# Patient Record
Sex: Male | Born: 1955
Health system: Southern US, Community
[De-identification: ages and names within clinical notes are randomized; demographics above are authoritative.]

## PROBLEM LIST (undated history)

## (undated) DIAGNOSIS — IMO0002 Reserved for concepts with insufficient information to code with codable children: Secondary | ICD-10-CM

## (undated) DIAGNOSIS — K259 Gastric ulcer, unspecified as acute or chronic, without hemorrhage or perforation: Secondary | ICD-10-CM

## (undated) DIAGNOSIS — G5602 Carpal tunnel syndrome, left upper limb: Secondary | ICD-10-CM

## (undated) DIAGNOSIS — I10 Essential (primary) hypertension: Secondary | ICD-10-CM

## (undated) DIAGNOSIS — R609 Edema, unspecified: Secondary | ICD-10-CM

## (undated) DIAGNOSIS — M109 Gout, unspecified: Secondary | ICD-10-CM

## (undated) HISTORY — DX: Edema, unspecified: R60.9

## (undated) HISTORY — PX: ABDOMINAL SURGERY: SHX537

## (undated) HISTORY — DX: Essential (primary) hypertension: I10

## (undated) HISTORY — DX: Reserved for concepts with insufficient information to code with codable children: IMO0002

## (undated) HISTORY — PX: OTHER SURGICAL HISTORY: SHX169

---

## 1997-04-23 ENCOUNTER — Emergency Department (HOSPITAL_COMMUNITY): Admission: EM | Admit: 1997-04-23 | Discharge: 1997-04-23 | Payer: Self-pay | Admitting: Emergency Medicine

## 1997-06-16 ENCOUNTER — Encounter: Admission: RE | Admit: 1997-06-16 | Discharge: 1997-06-16 | Payer: Self-pay | Admitting: *Deleted

## 2000-05-05 ENCOUNTER — Emergency Department (HOSPITAL_COMMUNITY): Admission: EM | Admit: 2000-05-05 | Discharge: 2000-05-05 | Payer: Self-pay | Admitting: Emergency Medicine

## 2000-07-27 ENCOUNTER — Emergency Department (HOSPITAL_COMMUNITY): Admission: EM | Admit: 2000-07-27 | Discharge: 2000-07-28 | Payer: Self-pay | Admitting: Emergency Medicine

## 2002-07-13 ENCOUNTER — Emergency Department (HOSPITAL_COMMUNITY): Admission: EM | Admit: 2002-07-13 | Discharge: 2002-07-13 | Payer: Self-pay | Admitting: Emergency Medicine

## 2002-07-13 ENCOUNTER — Encounter: Payer: Self-pay | Admitting: Emergency Medicine

## 2002-12-06 ENCOUNTER — Emergency Department (HOSPITAL_COMMUNITY): Admission: EM | Admit: 2002-12-06 | Discharge: 2002-12-06 | Payer: Self-pay | Admitting: Emergency Medicine

## 2002-12-07 ENCOUNTER — Inpatient Hospital Stay (HOSPITAL_COMMUNITY): Admission: EM | Admit: 2002-12-07 | Discharge: 2002-12-12 | Payer: Self-pay | Admitting: Emergency Medicine

## 2002-12-14 ENCOUNTER — Encounter (HOSPITAL_BASED_OUTPATIENT_CLINIC_OR_DEPARTMENT_OTHER): Admission: RE | Admit: 2002-12-14 | Discharge: 2003-03-14 | Payer: Self-pay | Admitting: Orthopedic Surgery

## 2006-07-18 ENCOUNTER — Inpatient Hospital Stay (HOSPITAL_COMMUNITY): Admission: EM | Admit: 2006-07-18 | Discharge: 2006-07-22 | Payer: Self-pay | Admitting: *Deleted

## 2006-07-18 ENCOUNTER — Ambulatory Visit: Payer: Self-pay | Admitting: Family Medicine

## 2008-06-08 IMAGING — CR DG CHEST 1V PORT
1 series · 1 of 1 positions shown · non-contrast
Comparison: None.

CLINICAL DATA: Chest pain. 
 PORTABLE CHEST - 07/18/2006:

[view not recorded]
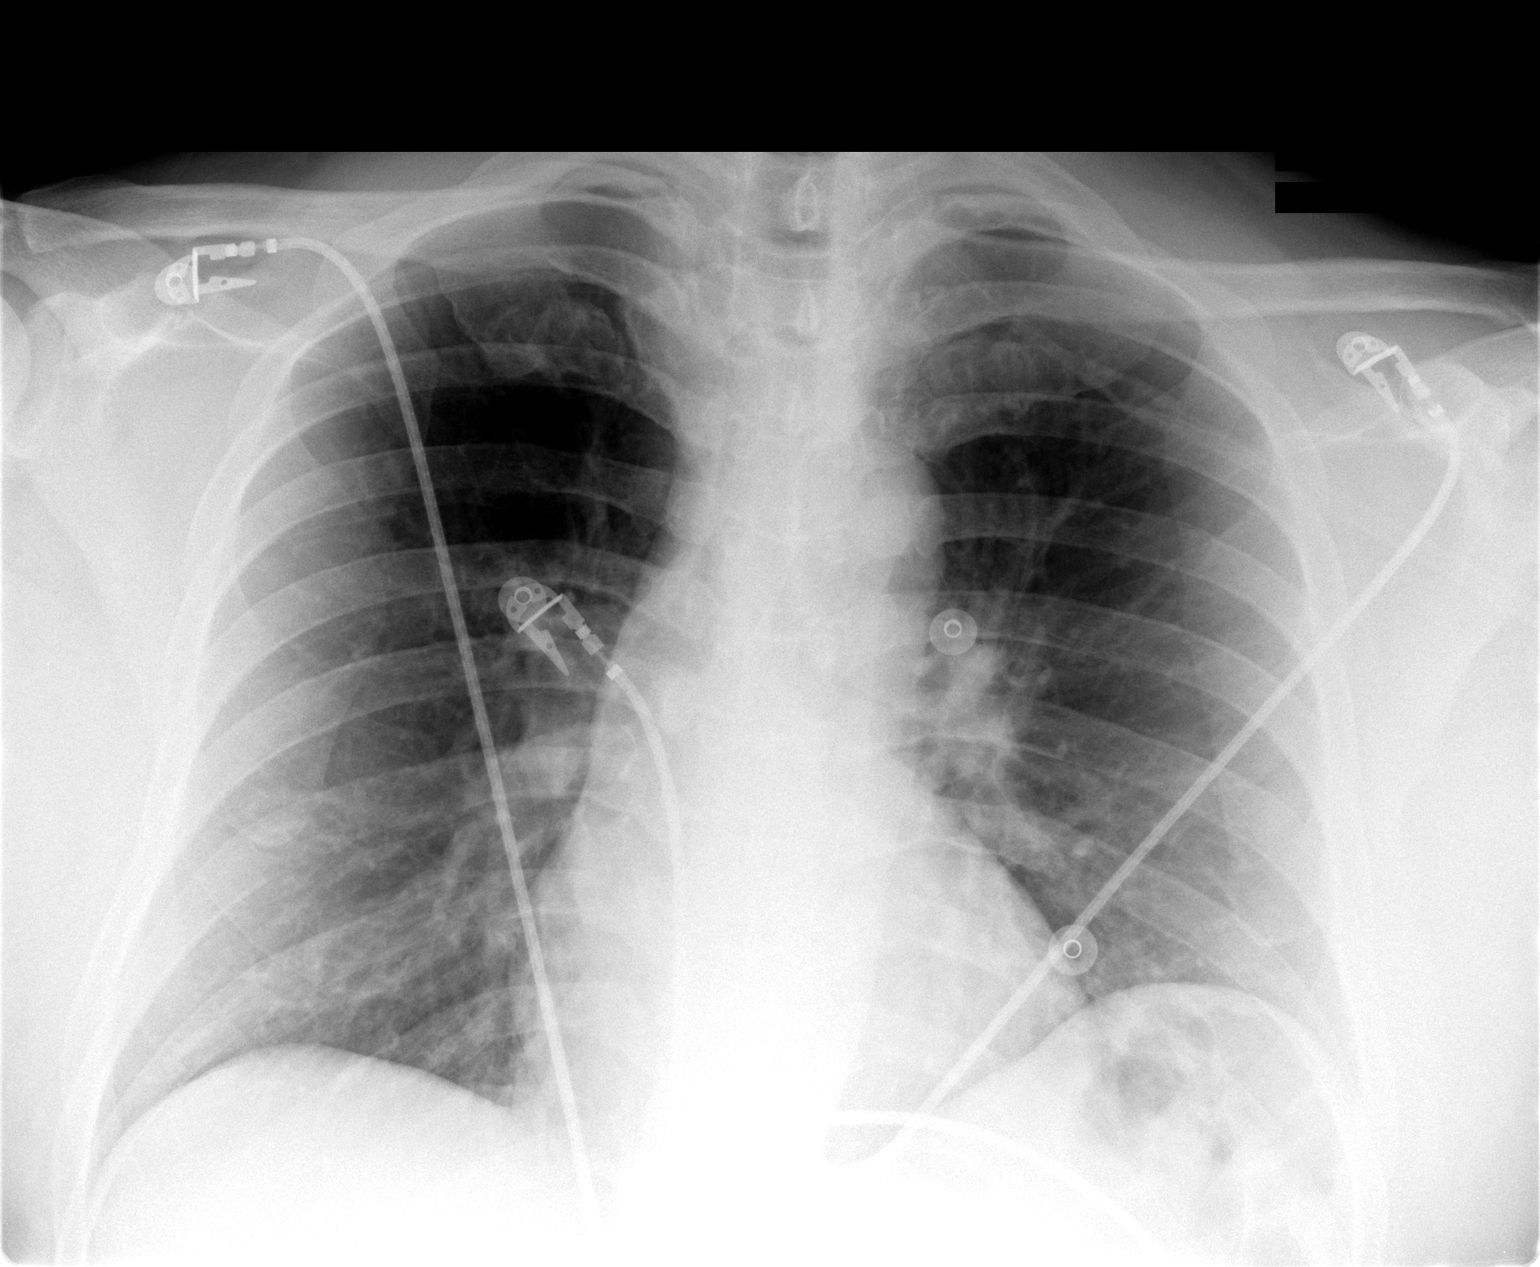

[1 of 1 positions shown; findings below may reference images not displayed]

FINDINGS: Cardiac silhouette, mediastinal and hilar contours are within normal limits.  Mild eventration of the left hemidiaphragm.  Minimal streaky basilar atelectasis due to low volume inspiration.  Probable emphysematous changes in the right upper lobe in particular.
IMPRESSION: No acute cardiopulmonary findings.  Minimal streaky bibasilar atelectasis and probable changes of COPD.

## 2010-05-16 NOTE — H&P (Signed)
Matthew Foster, Matthew Foster               ACCOUNT NO.:  [id number]   MEDICAL RECORD NO.:  [id number]          PATIENT TYPE:  EMS   LOCATION:  MAJO                         FACILITY:  [facility]   PHYSICIAN:  Pearlean Brownie, M.D.DATE OF BIRTH:  02-06-1955   DATE OF ADMISSION:  06/18/2006  DATE OF DISCHARGE:                              HISTORY & PHYSICAL   PRIMARY CARE PHYSICIAN:  [facility].  He will be admitted to the family  practice teaching services as an unassigned patient.   CHIEF COMPLAINT:  Chest pain.   HISTORY OF PRESENT ILLNESS:  Patient is a 55 year old African-American  male with a past medical history of hypertension who woke up last night  with chest pain that persisted throughout the night and all day today.  The pain is described as pressure and squeezing.  It is substernal with  radiation to the left shoulder and is associated with shortness of  breath, very mild diaphoresis.  It is constant and not changed with  exertion.  It was relieved with nitroglycerin.  He initially presented  to [facility] tonight and received nitroglycerin which decreased the pain  from 5/10 then to 3/10.  He received further nitroglycerin while in  transport in ambulance as well as in the emergency department; his pain  is now 1/10.  At [facility], he was noted to have T wave changes on his  EKG with inverted T waves in leads V4-V6 and so he was transferred via  EMS to [facility].  Of note, he has been having some left shoulder as  well as neck pain that was felt to be musculoskeletal in etiology.  He  has been having this pain for several weeks.  This pain is not  exertional and is completely different from the current pain that he is  having.  He was scheduled to have an exercise treadmill test later this  month for further risk stratification.   PAST MEDICAL HISTORY:  1. Hypertension.  2. Gout.   PAST SURGICAL HISTORY:  1. Ulcer surgery in 1980's.  2. I&D of right hand with MRSA abscess in  2005.   MEDICATIONS:  1. Lisinopril 20 mg p.o. q.day.  2. Atenolol 100 mg p.o. q.day.   ALLERGIES:  No known drug allergies.   FAMILY HISTORY:  Dad with diabetes, hypertension; he died at age 40  secondary to complications from diabetes.  His mom with diabetes,  hypertension; she died of a CVA at age 58 and she had an MI shortly  after her CVA.  Four brothers with hypertension, one of which has  diabetes.  He has two sisters who are healthy.   SOCIAL HISTORY:  He is married to his wife, Tyrell Antonio; they have  four children and one grandchild.  He works as a Naval architect.  He  denies any tobacco or alcohol use.  No illicit drug use.   REVIEW OF SYSTEMS:  He denies any orthopnea, nausea, vomiting, cough,  fevers, chills, calf pain or swelling.   PHYSICAL EXAMINATION:  VITALS:  Temperature 99.4, pulse 68, blood  pressure 137/78, respiratory rate 18, 99% on  two liters.  GENERAL:  Alert in no acute distress.  HEENT:  Poor dentition in his upper jaw.  Mucous membranes are moist.  Pupils are equally round and reactive to light and accommodation.  Extraocular motion intact.  Normal optic fundi.  NECK:  No JVD.  No carotid bruits.  No thyromegaly or thyroid nodules.  LUNGS:  Clear to auscultation bilaterally.  CARDIOVASCULAR EXAM:  Regular rate and rhythm.  No murmurs, rubs or  gallops.  CHEST WALL:  Nontender to palpation.  ABDOMINAL EXAM:  Positive surgical scar vertical in the epigastric  region.  Positive bowel sounds.  Soft, nontender, nondistended.  EXTREMITIES:  No cyanosis, clubbing, or edema.  No calf swelling or  tenderness.  Negative Homans sign.  NEURO:  Alert and oriented x3.  RECTAL EXAM:  Good sphincter tone.  Normal size prostate.  Stool was  Hemoccult-negative.   LABORATORY DATA:  White blood cell count 6.4, hemoglobin 14.2,  hematocrit 41.8, platelet count 270.  Sodium 140, potassium 4.1,  chloride 105, bicarb 30, BUN 17, creatinine 1.5, glucose 99.  Point-of-   care negative x2.   Chest x-ray:  No acute cardiopulmonary findings, streaky bibasilar  atelectasis.   EKG:  Normal sinus rhythm at 74 beats per minute, new inverted T waves  in lead V4-V6 with T wave flattening in leads V2-V3.   ASSESSMENT/PLAN:  A 55 year old male with hypertension now with chest  pain.   1. Chest pain substernally, relived with nitroglycerin, is constant      with EKG changes.  Possible unstable angina.  We will load with      Plavix 300 mg p.o. x1.  We will start heparin drip and use      nitroglycerin as needed.  We will cycle his cardiac enzymes.  We      will risk stratify by checking fasting lipid panel.  We will      consult cardiology in the morning if chest pain continues or if his      cardiac enzymes elevate.  Patient low-risk Wells criteria so we      will not check D-dimer at this point, and he also has a normal      pulse and no oxygen requirement.  2. Hypertension.  We will continue his lisinopril and Tylenol.  3. Gout.  No acute issues currently.  4. History of peptic ulcer disease.  Currently without abdominal pain.      Stool is heme-negative.  We will start Protonix incase GI etiology      of chest pain.      Benn Moulder, M.D.  Electronically Signed      Pearlean Brownie, M.D.  Electronically Signed    MR/MEDQ  D:  07/18/2006  T:  07/19/2006  Job:  [id number]

## 2010-05-16 NOTE — Cardiovascular Report (Signed)
[name], Matthew Foster               ACCOUNT NO.:  [id number]   MEDICAL RECORD NO.:  [id number]          PATIENT TYPE:  INP   LOCATION:  3713                         FACILITY:  [facility]   PHYSICIAN:  Madaline Savage, M.D.DATE OF BIRTH:  Nov 14, 1955   DATE OF PROCEDURE:  07/19/2006  DATE OF DISCHARGE:                            CARDIAC CATHETERIZATION   PROCEDURES PERFORMED.:  1. Selective coronary angiography by Judkins technique.  2. Retrograde left heart catheterization.  3. Left ventricular angiography.   COMPLICATIONS:  None.   ENTRY SITE:  Right femoral.   DYE USED:  Omnipaque.   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 Pearlean Brownie for chest pain.  His  EKG between the day of admission and today shows interval worsening of  mild ST-segment depressions in the anterolateral leads.  That along with  his chest pain is suspicion enough to warrant today's cardiac  catheterization which was performed electively without complications.   RESULTS:   PRESSURES:  Left ventricular pressure was 140/13, end-diastolic pressure  13.  Central aortic pressure 145/90 with a mean of 117.  No aortic valve  gradient by pullback technique of any significance.   ANGIOGRAPHIC RESULTS:  All vessels in the left main, LAD and left  circumflex distribution were normal.   Anatomically, there was one large septal perforator branch, one medium  diagonal, one obtuse marginal and the LAD and circumflex main branches  were normal.  The right coronary artery was large and dominant.  There  was a live tortuosity proximally after a mild shepherd's crook deformity  in the proximal RCA.  There was a 50-60% area of mild narrowing in the  proximal portion of the vessel.  Left ventricular ejection fraction was  60%.  There was no evidence of mitral regurgitation and qualitative wall  motion analysis revealed that there were no abnormalities noted.   IMPRESSION:  1. Mild single-vessel coronary disease.  2. Normal LV systolic function.   PLAN:  Continued medical therapy.  Suggest a functional test for this  patient, possibly adenosine Myoview, and follow-up.           ______________________________  Madaline Savage, M.D.     WHG/MEDQ  D:  07/19/2006  T:  07/20/2006  Job:  [id number]   cc:   Pearlean Brownie, M.D.  Cardiac Catheterization Laboratory

## 2010-05-16 NOTE — Discharge Summary (Signed)
[name], Foster               ACCOUNT NO.:  [id number]   MEDICAL RECORD NO.:  [id number]          PATIENT TYPE:  INP   LOCATION:  3713                         FACILITY:  [facility]   PHYSICIAN:  Matthew Foster, M.D.DATE OF BIRTH:  1955/02/28   DATE OF ADMISSION:  07/18/2006  DATE OF DISCHARGE:  07/22/2006                               DISCHARGE SUMMARY   PRIMARY Foster PHYSICIAN:  Matthew Foster in [place], [place].   CONSULTANTS:  Dr. Ezequiel Foster with [facility].   PROCEDURES:  1. Cardiac catheterization on July 19, 2006.  IMPRESSION:  Mild single-vessel coronary artery disease with 50 to 60% narrowing of  the proximal right coronary artery; normal left ventricular systolic  function.  1. Adenosine stress Myoview on July 21, 2006.   IMPRESSION:  Normal left ventricular function and wall motion, ejection fraction of  51%, anteroseptal perfusion defect on both rest and stress studies which  was more pronounced on the stress study.  Component of scar suspected,  and there may be also inducible ischemia in this territory given that  the defect is slightly more pronounced after adenosine administration.   REASON FOR ADMISSION:  Chest pain.   DISCHARGE DIAGNOSES:  PRIMARY DIAGNOSES:  1. Coronary artery disease.  2. Coronary artery vasospasm.   SECONDARY DIAGNOSES:  1. Hypertension.  2. Hyperlipidemia.  3. Gout.   DISCHARGE MEDICATIONS:  1. Aspirin 81 mg p.o. daily.  2. Zocor 20 mg p.o. daily (Prescription given, dispense #30, no      refills).  3. Norvasc 5 mg p.o. daily (Prescription given, dispense #30, no      refills).  4. Atenolol 100 mg p.o. daily.  5. Allopurinol 300 mg as previously prescribed.   HOSPITAL COURSE:  The patient is a 55 year old African-American male who  presented to the ED with chest pain that persisted throughout the night  and all day during the day of presentation.  The patient described the  pain as pressure and  squeezing.  It was substernal and radiated to the  left shoulder.  It was associated with shortness of breath and mild  diaphoresis.  The pain was constant and did not change with exertion.  The pain was also relieved with nitroglycerin.   1. Coronary artery disease--given [name] presentation and some      initial nonspecific T wave inversions on an outside EKG there was      concern that the patient was experiencing an NSTEMI.  Cardiology      was consulted and involved throughout the patient's      hospitalization.  His cardiac enzymes were cycled x3 and remained      consistently negative.  The morning following admission the      patient's repeat EKG had some worsening of mild ST segment      depressions in the anterolateral lead.  Therefore, the decision was      made by cardiology to take the patient to a cardiac      catheterization.  He was found to have some mild single-vessel      coronary  disease.  A functional test was recommended.  In the      interim the patient was treated as if he was having an NSTEMI and,      therefore, was on a heparin drip which was switched to Lovenox 120      mg subcutaneously q.12h.  On admission he had been loaded with      Plavix 300 mg and continued on 75 mg p.o. daily.  He underwent a      stress Myoview with results as described previously.  Therefore,      his Plavix, Lovenox and full-dose aspirin were discontinued.  The      patient was previously on atenolol and lisinopril for blood      pressure control.  As the patient does not have any specific      indications for an ACE inhibitor in particular, and because of the      questions by Cardiology of coronary artery vasospasm, the patient      was started on Norvasc 5 mg p.o. daily and the Lisinopril      discontinued.  The patient also should continue taking 81 mg      aspirin daily as well as Zocor 20 mg daily.  The patient has a      follow up scheduled with Dr. Elsie Foster of   Cardiology and is to follow      up with Matthew Foster on Friday July 26, 2006.  2. Hyperlipidemia--As the patient presented with chest pain concerning      for acute coronary syndrome he did have a fasting lipid panel drawn      from risk stratification purposes.  He was found to have a      triglyceride level of 70, HDL cholesterol level of 35 and LDL      cholesterol level of 86.  Given his low HDL and his known coronary      artery disease, the patient was started on Zocor 20 mg p.o. daily.      The patient will need monitoring as an outpatient for side effects      of this medication and for monitoring of his cholesterol levels      hopefully with increase of his HDL cholesterol and decrease of his      LDL cholesterol even further.  3. Hypertension--The patient was taking lisinopril and atenolol upon      admission.  Given his question of coronary vasospasm and no      specific indications for an ACE inhibitor therapy such as      congestive heart failure or diabetes, the Lisinopril was      discontinued and the patient discharged on Norvasc 5 mg p.o. daily.      He will need follow up with his primary Foster Matthew Foster for further      titration of his blood pressure medication to insure that they are      at goal.  On the day of discharge the patient's blood pressure was      ranging from 117 to 147/77 to 97.  Would not recommend further      increase in his beta blocker as his heart rate was in the 50s.   PATIENT CONDITION AT TIME OF DISCHARGE:  Stable.   DISCHARGE LABORATORY STUDIES:  Triglyceride 70, HDL 35, LDL 86.  Sodium  140, potassium 4.0, BUN 8, creatinine 1.01.  Hemoglobin 14.1.   DISPOSITION:  The patient is discharged home.  DISCHARGE FOLLOW UP:  1. The patient is to return to [facility] on Friday, July 29, 2006 for      hospital follow up.  The patient expressed a desire to continue      receiving his primary Foster needs at [facility] and not establishing      Foster at  an alternative office.  2. Dr. Elsie Foster of cardiology on Friday, August 09, 2006 at 12 o'clock      p.m.   FOLLOW UP ISSUES:  1. [name] lisinopril was discontinued as discussed previously      and substituted for Norvasc 5 mg p.o. daily.  The patient will need      monitoring of his high blood pressure.  2. The patient was started on a statin and will need monitoring for      side effects and muscle or liver damage.      Matthew Franklin, MD  Electronically Signed      Matthew Foster, M.D.  Electronically Signed    TCB/MEDQ  D:  07/22/2006  T:  07/23/2006  Job:  [id number]

## 2010-05-19 NOTE — Discharge Summary (Signed)
Matthew Foster, Matthew Foster                           ACCOUNT NO.:  [id number]   MEDICAL RECORD NO.:  [id number]                   PATIENT TYPE:  INP   LOCATION:  0359                                 FACILITY:  [facility]   PHYSICIAN:  Dionne Ano. Amanda Pea, M.D.             DATE OF BIRTH:  [birth year]   DATE OF ADMISSION:  12/07/2002  DATE OF DISCHARGE:  12/12/2002                                 DISCHARGE SUMMARY   ADMISSION DIAGNOSES:  1. Left ring, left index finger abscess.  2. Previous history of hypertension.  3. Questionable history of diabetes mellitus.   DISCHARGE DIAGNOSES:  1. Left ring, left index finger abscess, improved.  2. Previous history of hypertension.  3. Questionable history of diabetes mellitus.   SURGEON:  Dionne Ano. Amanda Pea, M.D.   CONSULTATIONS:  None.   BRIEF HISTORY:  Matthew Foster is a 55 year old gentleman who presented to the  [facility] emergency room for evaluation of his left ring and index  fingers. He states that he had noted pain, tenderness, and swelling for  approximately a week's time without incidence of injury. He stated that the  swelling and pain had increased to the point where he was having difficulty  utilizing the hand. Therefore, he presented to the emergency room for  evaluation. The patient does have a history of hypertension and notes on his  review of systems that he does has frequent nocturia and had a questionable  history of diabetes at one time, but had not followed up for this. He  currently sees urgent care for his medical needs, but states he has not been  there in quite a time.   Physical examination showed dorsal soft tissue swelling with localized  erythema and fluctuance over the middle finger and index finger by the left  hand indicative of two separate abscesses. The mid palm, thenar, and  hypothenar space were quiescent. Neurovasculature intact. Normal range of  motion.  ___________ index and ring finger as these were certainly  tender  which limited his range of motion. The patient underwent I&D about the left  index and ring fingers while in the emergency department setting and  tolerated this very well. Wick and soft dressings were applied, and the  patient was admitted for IV antibiotics, and continued wound care.   HOSPITAL COURSE:  Matthew Foster was admitted on December 07, 2002, he tolerated  his I&D very well. He was placed on Ancef prophylactically his final  cultures were obtained. On December __________, 2004, Gram stain showed gram-  positive cocci in pairs. Due to his questionable history of, hemoglobin A1C  had previously been ordered and was 6.0.  His blood pressure was 163/95 and  temperature of 98. Upper extremity showed the digits had excellent refill,  sensation intact. Over the course of the next several days, the patient  continued hydrotherapy and his IV antibiotics.  It was noted that he  had  reaccumulation about the abscess and was incised and drained at bedside  without difficulty. He tolerated that very well. There were no  complications. Final cultures showed moderate MRSA sensitive to Levaquin,  vancomycin, tetracycline, and doxycycline. At this juncture, vancomycin was  begun for pharmacy dosing and continued whirlpool and wound care was  employed. On December 12, 2002, the patient's wounds were healing nicely  with no reaccumulation of infection noted. He was relatively nontender and  ____________ for discharge which was in improved condition. The decision was  made to discharge him home.   FINAL DIAGNOSES:  1. Status post incision and drainage times two, ring and index finger     abscess, with cultures indicative of methicillin-resistant Staphylococcus     aureus.  2. History of hypertension.   PLAN:   CONDITION ON DISCHARGE:  Improved.   DIET:  Regular.   ACTIVITY:  Encourage range of motion to the hand and fingers, daily wound  care, and the patient will be set up for  outpatient hydrotherapy for Monday  and subsequently will change his dressings Tuesday and Thursday at home.   DISCHARGE MEDICATIONS:  1. Tetracycline 500 mg one p.o. q.i.d. times four weeks.  2. Percocet.  3. Robaxin.  4. Colace.  5. Allopurinol as the patient did have an inpatient exacerbation of first     metatarsal gout.  6. A prescription for Accupril, a hypertensive medication he was started on     while inpatient to control his blood pressure was given to him with     strict instructions to follow up with his primary care physician for     further maintenance of his hypertension.   He will follow up with Dr. Amanda Pea in one week.     Karie Chimera, P.A.-C.                   Dionne Ano. Amanda Pea, M.D.    BB/MEDQ  D:  01/21/2003  T:  01/22/2003  Job:  [id number]

## 2010-05-19 NOTE — Op Note (Signed)
NAMEJASYAH, THEURER                           ACCOUNT NO.:  [id number]   MEDICAL RECORD NO.:  [id number]                   PATIENT TYPE:  INP   LOCATION:  0359                                 FACILITY:  [facility]   PHYSICIAN:  Dionne Ano. Everlene Other, M.D.         DATE OF BIRTH:  Oct 24, 1955   DATE OF PROCEDURE:  12/07/2002  DATE OF DISCHARGE:                                 OPERATIVE REPORT   I had the pleasure to see Matthew Foster today in the [facility] emergency  room.  He has had a full H&P written.  This patient is a very pleasant 55-  year-old male who presents with left index finger and left ring finger  abscess.  This has been going on in the ring finger for approximately a week  and in the index finger a shorter period of time.  The patient denies  numbness or tingling in the tips of his fingers, and the process is located  in the dorsal apparatus of his fingers.  He has a history of hypertension.  He notes that he does have frequent urination at night and there was a  question of diabetes at one time, although he has not followed up on this.  The patient currently sees Urgent Care for his regular medical needs but has  not been there in quite some time.  We have reviewed his past medical and  surgical history as written in his chart.   His examination shows a dorsal soft tissue swelling with localized erythema  and fluctuance over the middle finger and index finger, left hand,  indicative of two separate abscesses.  Midpalm, thenar, and hypothenar space  are quiescent.  The right upper extremity is nontender.  Neurovascularly  intact.  Normal ____________ and range of motion.  The area in question is,  of course, in the left hand.  His wrist, elbow, and shoulder are nontender  in the left hand.  He walks with a non-antalgic gait.  He is alert and  oriented.  His abdomen is benign.  Chest is clear.  X-rays have not revealed  any obvious bony abnormality.   We have reviewed  these findings at length today.  I have discussed with the  patient the current upper extremity predicament in great detail.   IMPRESSION:  1. Abscess, left hand, about the index finger.  2. Abscess, left hand, about the ring finger.  3. Rule out diabetes.  4. Hypertension.   PLAN:  I have discussed with the patient his findings and treatment options.  At present time we have agreed to proceed with I&D.  All questions were  encouraged and answered.   The patient was taken to the procedural suite.  He underwent a lidocaine  block.  Following this he underwent Betadine scrub and paint followed by  securing a sterile field.  Once this was done the patient had the ring  finger identified and  an incision where the abscess was pointing was made,  and I&D of skin and subcutaneous tissue as well as peritenon tissue and deep  tissue was accomplished.  This was I&D'd of the deep abscess about the  dorsal apparatus of the finger with purulence noted.  Following this it was  copiously lavaged and then wicked with an iodoform gauze, quarter-inch in  nature.  Once this was done the patient underwent I&D of the left index  finger.  An incision was made with a #11 blade knife.  Dissection was  carried down.  The abscess was identified and decompressed.  Following this  it was copiously irrigated.  This was an I&D of a deep abscess and once this  was done, I then packed it with iodoform gauze.  All areas were copiously  lavaged prior to placement of iodoform gauze, and cultures were taken x1 for  aerobic, anaerobic, fungal, and atypical mycobacterial species.  The patient  tolerated the I&D well.  He was begun on Ancef 2 g IV q.8h. and will be  monitored very closely.  Will plan for elevation, monitoring of the  cultures, and will launch a workup to rule out diabetes.  I have discussed  with him do's and don'ts, etc., and the proposed treatment algorithm.  He  understands this and will be admitted to  the hospital.  I do feel this is an  admission that is mandatory given the deep abscess and the symptoms that he  is having.  I am also concerned about possible diabetes mellitus being  present, and he understands this as well as the reason for admission.  We  have gone over do's and don'ts, etc., and all questions have been encouraged  and answered.                                               Dionne Ano. Everlene Other, M.D.    Nash Mantis  D:  12/07/2002  T:  12/08/2002  Job:  [id number]   cc:   Benny Lennert, M.D.

## 2010-09-14 ENCOUNTER — Emergency Department (HOSPITAL_COMMUNITY)
Admission: EM | Admit: 2010-09-14 | Discharge: 2010-09-15 | Disposition: A | Discharge status: Home / Self Care | Payer: BC Managed Care – PPO | Attending: Emergency Medicine | Admitting: Emergency Medicine

## 2010-09-14 DIAGNOSIS — Z862 Personal history of diseases of the blood and blood-forming organs and certain disorders involving the immune mechanism: Secondary | ICD-10-CM | POA: Insufficient documentation

## 2010-09-14 DIAGNOSIS — I10 Essential (primary) hypertension: Secondary | ICD-10-CM | POA: Insufficient documentation

## 2010-09-14 DIAGNOSIS — Z8639 Personal history of other endocrine, nutritional and metabolic disease: Secondary | ICD-10-CM | POA: Insufficient documentation

## 2010-09-14 DIAGNOSIS — L03019 Cellulitis of unspecified finger: Secondary | ICD-10-CM | POA: Insufficient documentation

## 2010-09-14 DIAGNOSIS — M79609 Pain in unspecified limb: Secondary | ICD-10-CM | POA: Insufficient documentation

## 2010-09-14 DIAGNOSIS — R079 Chest pain, unspecified: Secondary | ICD-10-CM | POA: Insufficient documentation

## 2010-09-14 DIAGNOSIS — M7989 Other specified soft tissue disorders: Secondary | ICD-10-CM | POA: Insufficient documentation

## 2010-09-14 DIAGNOSIS — Z79899 Other long term (current) drug therapy: Secondary | ICD-10-CM | POA: Insufficient documentation

## 2010-09-15 ENCOUNTER — Emergency Department (HOSPITAL_COMMUNITY): Payer: BC Managed Care – PPO

## 2010-09-15 LAB — POCT I-STAT TROPONIN I: Troponin i, poc: 0 ng/mL (ref 0.00–0.08)

## 2010-09-15 LAB — DIFFERENTIAL
Basophils Absolute: 0 10*3/uL (ref 0.0–0.1)
Basophils Relative: 0 % (ref 0–1)
Lymphocytes Relative: 22 % (ref 12–46)
Neutro Abs: 5.3 10*3/uL (ref 1.7–7.7)
Neutrophils Relative %: 68 % (ref 43–77)

## 2010-09-15 LAB — BASIC METABOLIC PANEL
CO2: 30 mEq/L (ref 19–32)
Chloride: 102 mEq/L (ref 96–112)
Glucose, Bld: 100 mg/dL — ABNORMAL HIGH (ref 70–99)
Potassium: 3.8 mEq/L (ref 3.5–5.1)
Sodium: 137 mEq/L (ref 135–145)

## 2010-09-15 LAB — CBC
Hemoglobin: 14.2 g/dL (ref 13.0–17.0)
RBC: 5.16 MIL/uL (ref 4.22–5.81)

## 2010-10-05 ENCOUNTER — Encounter: Payer: Self-pay | Admitting: *Deleted

## 2010-10-05 ENCOUNTER — Encounter: Payer: Self-pay | Admitting: Cardiovascular Disease

## 2010-10-06 ENCOUNTER — Encounter: Payer: Self-pay | Admitting: Cardiovascular Disease

## 2010-10-06 ENCOUNTER — Ambulatory Visit (INDEPENDENT_AMBULATORY_CARE_PROVIDER_SITE_OTHER): Payer: BC Managed Care – PPO | Admitting: Cardiovascular Disease

## 2010-10-06 DIAGNOSIS — R9431 Abnormal electrocardiogram [ECG] [EKG]: Secondary | ICD-10-CM

## 2010-10-06 DIAGNOSIS — I10 Essential (primary) hypertension: Secondary | ICD-10-CM

## 2010-10-06 DIAGNOSIS — R079 Chest pain, unspecified: Secondary | ICD-10-CM | POA: Insufficient documentation

## 2010-10-06 NOTE — Assessment & Plan Note (Signed)
Randomize Promise Trial stress myovue or CT

## 2010-10-06 NOTE — Assessment & Plan Note (Signed)
Likely nonspecific changes and not ischemic  ? Related to BP

## 2010-10-06 NOTE — Progress Notes (Signed)
55 you with HTN seen in ER for paranechyia.  Indicated SSCP.  ECG with lateral T wave changes.  Pain is somewhat atypical in the shoulder blades Can be at rest.  Has had it for a few months.  Previous cath at [facility] over 20 years ago was ok.  Cholesterol ok, BP well Rx since 2000.  No diabetes or smoking.  Pain lasts less than a minute and can be positional.  Discussed risk stratification. With HTN and abnormal ECG will try to randomize to myovue or CT in [facility] trial  ROS: Denies fever, malais, weight loss, blurry vision, decreased visual acuity, cough, sputum, SOB, hemoptysis, pleuritic pain, palpitaitons, heartburn, abdominal pain, melena, lower extremity edema, claudication, or rash.  All other systems reviewed and negative   General: Affect appropriate Healthy:  appears stated age HEENT: normal Neck supple with no adenopathy JVP normal no bruits no thyromegaly Lungs clear with no wheezing and good diaphragmatic motion Heart:  S1/S2 no murmur,rub, gallop or click PMI normal Abdomen: benighn, BS positve, no tenderness, no AAA no bruit.  No HSM or HJR Distal pulses intact with no bruits No edema Neuro non-focal Skin warm and dry No muscular weakness  Medications Current Outpatient Prescriptions  Medication Sig Dispense Refill  . amLODipine (NORVASC) 10 MG tablet Take 10 mg by mouth daily.        Marland Kitchen amoxicillin (AMOXIL) 875 MG tablet Take 875 mg by mouth 2 (two) times daily.        Marland Kitchen aspirin 81 MG tablet Take 81 mg by mouth daily.        Marland Kitchen lisinopril (PRINIVIL,ZESTRIL) 20 MG tablet Take 20 mg by mouth daily.        . metoprolol (TOPROL-XL) 50 MG 24 hr tablet Take 50 mg by mouth daily.        . Pseudoeph-Chlorphen-Hydrocod (ZUTRIPRO) 60-4-5 MG/5ML SOLN Take by mouth as needed.          Allergies Review of patient's allergies indicates no known allergies.  Family History: No family history on file.  Social History: History   Social History  . Marital Status: Married    Spouse  Name: N/A    Number of Children: N/A  . Years of Education: N/A   Occupational History  . Not on file.   Social History Main Topics  . Smoking status: Former Games developer  . Smokeless tobacco: Not on file  . Alcohol Use: Not on file  . Drug Use: Not on file  . Sexually Active: Not on file   Other Topics Concern  . Not on file   Social History Narrative  . No narrative on file    Electrocardiogram: NSR 10/1 NSR 61 nonspecific inferior ST changes    Assessment and Plan

## 2010-10-06 NOTE — Patient Instructions (Signed)
Your physician has requested that you have cardiac CT. Cardiac computed tomography (CT) is a painless test that uses an x-ray machine to take clear, detailed pictures of your heart. For further information please visit www.cardiosmart.org. Please follow instruction sheet as given.   

## 2010-10-06 NOTE — Progress Notes (Signed)
Addended by: Freddi Starr on: 10/06/2010 04:04 PM   Modules accepted: Orders

## 2010-10-06 NOTE — Assessment & Plan Note (Signed)
Well controlled.  Continue current medications and low sodium Dash type diet.    

## 2010-10-09 ENCOUNTER — Encounter: Payer: Self-pay | Admitting: Cardiovascular Disease

## 2010-10-16 ENCOUNTER — Telehealth: Payer: Self-pay | Admitting: *Deleted

## 2010-10-16 LAB — COMPREHENSIVE METABOLIC PANEL
ALT: 25
AST: 21
Albumin: 3.3 — ABNORMAL LOW
CO2: 29
CO2: 32
Calcium: 8.5
Calcium: 8.9
Creatinine, Ser: 1.08
Creatinine, Ser: 1.2
GFR calc Af Amer: >60
GFR calc non Af Amer: >60
Glucose, Bld: 100 — ABNORMAL HIGH
Sodium: 137
Total Bilirubin: 0.5
Total Protein: 6

## 2010-10-16 LAB — BASIC METABOLIC PANEL
BUN: 11
BUN: 8
Calcium: 9
Calcium: 9.1
Creatinine, Ser: 1.09
GFR calc Af Amer: >60
GFR calc non Af Amer: >60
GFR calc non Af Amer: >60
GFR calc non Af Amer: >60
Glucose, Bld: 95
Glucose, Bld: 95
Potassium: 4
Potassium: 4.5
Sodium: 140
Sodium: 142

## 2010-10-16 LAB — I-STAT 8, (EC8 V) (CONVERTED LAB)
Acid-Base Excess: 3 — ABNORMAL HIGH
Bicarbonate: 29 — ABNORMAL HIGH
Chloride: 105
HCT: 44
Operator id: 272551
pCO2, Ven: 47.5

## 2010-10-16 LAB — D-DIMER, QUANTITATIVE: D-Dimer, Quant: <0.22

## 2010-10-16 LAB — DIFFERENTIAL
Basophils Relative: 1
Monocytes Absolute: 0.5
Monocytes Relative: 8
Neutro Abs: 3.8

## 2010-10-16 LAB — CBC
HCT: 40.4
HCT: 41.1
HCT: 41.4
HCT: 41.8
HCT: 42.2
Hemoglobin: 13.5
Hemoglobin: 13.7
Hemoglobin: 14.1
Hemoglobin: 14.2
MCHC: 33.3
MCHC: 33.7
MCHC: 33.9
MCV: 83.4
MCV: 83.4
Platelets: 242
Platelets: 247
Platelets: 259
RBC: 4.84
RBC: 4.93
RBC: 5.01
RBC: 5.01
RDW: 13
RDW: 13.2
RDW: 13.5
WBC: 5.4
WBC: 5.8
WBC: 6.8

## 2010-10-16 LAB — POCT CARDIAC MARKERS
CKMB, poc: <1 — ABNORMAL LOW
Operator id: 272551

## 2010-10-16 LAB — CARDIAC PANEL(CRET KIN+CKTOT+MB+TROPI)
CK, MB: 1.3
CK, MB: 1.5
Relative Index: INVALID
Relative Index: INVALID
Total CK: 87
Troponin I: 0.02

## 2010-10-16 LAB — HEPARIN LEVEL (UNFRACTIONATED)
Heparin Unfractionated: 0.88 — ABNORMAL HIGH
Heparin Unfractionated: <0.1 — ABNORMAL LOW

## 2010-10-16 LAB — RAPID URINE DRUG SCREEN, HOSP PERFORMED
Amphetamines: NOT DETECTED
Barbiturates: NOT DETECTED
Benzodiazepines: NOT DETECTED
Cocaine: NOT DETECTED
Opiates: POSITIVE — AB

## 2010-10-16 LAB — APTT: aPTT: 114 — ABNORMAL HIGH

## 2010-10-16 LAB — LIPID PANEL
Cholesterol: 135
HDL: 35 — ABNORMAL LOW
Triglycerides: 70

## 2010-10-16 LAB — PROTIME-INR: INR: 1.1

## 2010-10-16 LAB — POCT I-STAT CREATININE: Creatinine, Ser: 1.5

## 2010-10-16 NOTE — Telephone Encounter (Signed)
Patient notified of CT angio schedule. Patient given instructions.

## 2010-11-01 ENCOUNTER — Ambulatory Visit (HOSPITAL_COMMUNITY)
Admission: RE | Admit: 2010-11-01 | Discharge: 2010-11-01 | Disposition: A | Discharge status: Home / Self Care | Payer: BC Managed Care – PPO | Source: Ambulatory Visit | Attending: Cardiovascular Disease | Admitting: Cardiovascular Disease

## 2010-11-01 DIAGNOSIS — R079 Chest pain, unspecified: Secondary | ICD-10-CM | POA: Insufficient documentation

## 2010-11-01 DIAGNOSIS — R9431 Abnormal electrocardiogram [ECG] [EKG]: Secondary | ICD-10-CM

## 2010-11-01 DIAGNOSIS — J984 Other disorders of lung: Secondary | ICD-10-CM | POA: Insufficient documentation

## 2010-11-01 DIAGNOSIS — I10 Essential (primary) hypertension: Secondary | ICD-10-CM

## 2010-11-01 DIAGNOSIS — I517 Cardiomegaly: Secondary | ICD-10-CM | POA: Insufficient documentation

## 2010-11-01 MED ORDER — IOHEXOL 350 MG/ML SOLN
80.0000 mL | Freq: Once | INTRAVENOUS | Status: AC | PRN
  Administered 2010-11-01: 80 mL via INTRAVENOUS

## 2012-03-11 ENCOUNTER — Emergency Department (HOSPITAL_COMMUNITY): Payer: BC Managed Care – PPO

## 2012-03-11 ENCOUNTER — Emergency Department (HOSPITAL_COMMUNITY)
Admission: EM | Admit: 2012-03-11 | Discharge: 2012-03-12 | Disposition: A | Discharge status: Home / Self Care | Payer: BC Managed Care – PPO | Attending: Emergency Medicine | Admitting: Emergency Medicine

## 2012-03-11 ENCOUNTER — Encounter (HOSPITAL_COMMUNITY): Payer: Self-pay | Admitting: Emergency Medicine

## 2012-03-11 DIAGNOSIS — Z79899 Other long term (current) drug therapy: Secondary | ICD-10-CM | POA: Insufficient documentation

## 2012-03-11 DIAGNOSIS — Z8719 Personal history of other diseases of the digestive system: Secondary | ICD-10-CM | POA: Insufficient documentation

## 2012-03-11 DIAGNOSIS — Z7982 Long term (current) use of aspirin: Secondary | ICD-10-CM | POA: Insufficient documentation

## 2012-03-11 DIAGNOSIS — M62838 Other muscle spasm: Secondary | ICD-10-CM | POA: Insufficient documentation

## 2012-03-11 DIAGNOSIS — I10 Essential (primary) hypertension: Secondary | ICD-10-CM | POA: Insufficient documentation

## 2012-03-11 DIAGNOSIS — Z8739 Personal history of other diseases of the musculoskeletal system and connective tissue: Secondary | ICD-10-CM | POA: Insufficient documentation

## 2012-03-11 HISTORY — DX: Carpal tunnel syndrome, left upper limb: G56.02

## 2012-03-11 HISTORY — DX: Gastric ulcer, unspecified as acute or chronic, without hemorrhage or perforation: K25.9

## 2012-03-11 LAB — POCT I-STAT TROPONIN I: Troponin i, poc: 0 ng/mL (ref 0.00–0.08)

## 2012-03-11 LAB — CBC WITH DIFFERENTIAL/PLATELET
Basophils Relative: 1 % (ref 0–1)
Eosinophils Absolute: 0.2 10*3/uL (ref 0.0–0.7)
Eosinophils Relative: 4 % (ref 0–5)
HCT: 41.6 % (ref 39.0–52.0)
Hemoglobin: 14.4 g/dL (ref 13.0–17.0)
Lymphs Abs: 2.1 10*3/uL (ref 0.7–4.0)
MCH: 28.1 pg (ref 26.0–34.0)
MCHC: 34.6 g/dL (ref 30.0–36.0)
MCV: 81.1 fL (ref 78.0–100.0)
Monocytes Absolute: 0.3 10*3/uL (ref 0.1–1.0)
Monocytes Relative: 5 % (ref 3–12)
Neutrophils Relative %: 59 % (ref 43–77)
RBC: 5.13 MIL/uL (ref 4.22–5.81)

## 2012-03-11 LAB — COMPREHENSIVE METABOLIC PANEL
Alkaline Phosphatase: 78 U/L (ref 39–117)
BUN: 19 mg/dL (ref 6–23)
Creatinine, Ser: 0.97 mg/dL (ref 0.50–1.35)
GFR calc Af Amer: >90 mL/min (ref >90)
Glucose, Bld: 140 mg/dL — ABNORMAL HIGH (ref 70–99)
Potassium: 3.7 mEq/L (ref 3.5–5.1)
Total Protein: 7 g/dL (ref 6.0–8.3)

## 2012-03-11 NOTE — ED Notes (Signed)
Pt to xray at this time.

## 2012-03-11 NOTE — ED Notes (Signed)
Pt st's he has had pain in left chest radiating into left shoulder and upper back off and on x's 3 days.  Pt st's today the pain has been constant with some diaphoresis.

## 2012-03-12 MED ORDER — NAPROXEN 250 MG PO TABS
250.0000 mg | ORAL_TABLET | Freq: Once | ORAL | Status: DC
  Filled 2012-03-12: qty 1

## 2012-03-12 MED ORDER — DIAZEPAM 5 MG PO TABS: 5.0000 mg | ORAL_TABLET | Freq: Three times a day (TID) | ORAL | Status: DC | PRN

## 2012-03-12 MED ORDER — DIAZEPAM 5 MG PO TABS
5.0000 mg | ORAL_TABLET | Freq: Once | ORAL | Status: DC
  Filled 2012-03-12: qty 1

## 2012-03-12 MED ORDER — NAPROXEN 250 MG PO TABS: 250.0000 mg | ORAL_TABLET | Freq: Two times a day (BID) | ORAL | Status: DC

## 2012-03-12 NOTE — ED Provider Notes (Addendum)
History     CSN: [id number]  Arrival date & time 03/11/12  2106   First MD Initiated Contact with Patient 03/11/12 2347      Chief Complaint  Patient presents with  . Chest Pain    (Consider location/radiation/quality/duration/timing/severity/associated sxs/prior treatment) HPI 57 year old male presents to emergency room with complaint of 3 days of left shoulder, chest, neck, and arm pain.  He reports the symptoms have worsened today and has been all day.  Pain is worsened with lifting.  His left arm, turning his head to the left, or flexion extension of the neck.  No trauma to the area.  No new or unusual activities.  The patient had workup in the year and half ago for chest pain, had negative coronary CTA.  Patient is followed by Dr. Eden Emms.  He has not been back to see him in the last year, and a half.  He is taking his medications as prescribed.  He denies any shortness of breath, no nausea.  He did have a mild bout of diaphoresis with the pain was particularly bad today. Past Medical History  Diagnosis Date  . HTN (hypertension)   . Edema   . Chest pain   . Paronychia   . Carpal tunnel syndrome of left wrist   . Gastric ulcer     Past Surgical History  Procedure Laterality Date  . Abdominal surgery    . Gastric ulcer repair      No family history on file.  History  Substance Use Topics  . Smoking status: Never Smoker   . Smokeless tobacco: Not on file  . Alcohol Use: No      Review of Systems  See History of Present Illness; otherwise all other systems are reviewed and negative Allergies  Review of patient's allergies indicates no known allergies.  Home Medications   Current Outpatient Rx  Name  Route  Sig  Dispense  Refill  . amLODipine (NORVASC) 10 MG tablet   Oral   Take 10 mg by mouth daily.           Marland Kitchen amoxicillin (AMOXIL) 875 MG tablet   Oral   Take 875 mg by mouth 2 (two) times daily.           Marland Kitchen aspirin 81 MG tablet   Oral   Take 81 mg  by mouth daily.           Marland Kitchen lisinopril (PRINIVIL,ZESTRIL) 20 MG tablet   Oral   Take 20 mg by mouth daily.           . metoprolol (TOPROL-XL) 50 MG 24 hr tablet   Oral   Take 50 mg by mouth daily.           . Pseudoeph-Chlorphen-Hydrocod (ZUTRIPRO) 60-4-5 MG/5ML SOLN   Oral   Take by mouth as needed.             BP 145/92  Pulse 61  Temp(Src) 97.9 F (36.6 C) (Oral)  Resp 16  SpO2 97%  Physical Exam  Nursing note and vitals reviewed. Constitutional: He is oriented to person, place, and time. He appears well-developed and well-nourished.  HENT:  Head: Normocephalic and atraumatic.  Nose: Nose normal.  Mouth/Throat: Oropharynx is clear and moist.  Eyes: Conjunctivae and EOM are normal. Pupils are equal, round, and reactive to light.  Neck: Normal range of motion. Neck supple. No JVD present. No tracheal deviation present. No thyromegaly present.  Cardiovascular: Normal rate, regular rhythm, normal  heart sounds and intact distal pulses.  Exam reveals no gallop and no friction rub.   No murmur heard. Pulmonary/Chest: Effort normal and breath sounds normal. No stridor. No respiratory distress. He has no wheezes. He has no rales. He exhibits no tenderness.  Abdominal: Soft. Bowel sounds are normal. He exhibits no distension and no mass. There is no tenderness. There is no rebound and no guarding.  Musculoskeletal: Normal range of motion. He exhibits tenderness (patient with tenderness along the left trapezius muscle with muscle spasm noted.  Palpation of the area reproduces his pain.). He exhibits no edema.  Lymphadenopathy:    He has no cervical adenopathy.  Neurological: He is alert and oriented to person, place, and time. He exhibits normal muscle tone. Coordination normal.  Skin: Skin is warm and dry. No rash noted. No erythema. No pallor.  Psychiatric: He has a normal mood and affect. His behavior is normal. Judgment and thought content normal.    ED Course   Procedures (including critical care time)  Labs Reviewed  COMPREHENSIVE METABOLIC PANEL - Abnormal; Notable for the following:    Glucose, Bld 140 (*)    Total Bilirubin 0.2 (*)    All other components within normal limits  CBC WITH DIFFERENTIAL  POCT I-STAT TROPONIN I   Dg Chest 2 View  03/11/2012  *RADIOLOGY REPORT*  Clinical Data: Chest pain for 3 days  CHEST - 2 VIEW  Comparison: 09/15/2010  Findings: Normal heart size, mediastinal contours, and pulmonary vascularity. Mild chronic bronchitic changes. Lungs otherwise clear. No pleural effusion or pneumothorax. No acute bony abnormalities.  IMPRESSION: Mild chronic bronchitic changes.   Original Report Authenticated By: Ulyses Southward, M.D.     Date: 03/12/2012  Rate:62  Rhythm: normal sinus rhythm  QRS Axis: normal  Intervals: normal  ST/T Wave abnormalities: normal  Conduction Disutrbances:none  Narrative Interpretation:   Old EKG Reviewed: none available    1. Trapezius muscle spasm       MDM  57 year old male with left shoulder pain, with some radiation into his chest.  I do not think this is a primary cardiac issue.  We'll have him followup with Dr. in addition for his elevated blood pressures here in the emergency department today.  We'll start on Naprosyn and Valium.  Patient instructed not to drive tomorrow.        Olivia Mackie, MD 03/12/12 5784  Olivia Mackie, MD 03/12/12 [phone]

## 2012-08-06 IMAGING — CR DG CHEST 2V
2 series · 2 of 2 positions shown · non-contrast
Comparison: 07/18/2006

CLINICAL DATA: Hypertension, back and neck pain.

CHEST - 2 VIEW

[w chest pa]
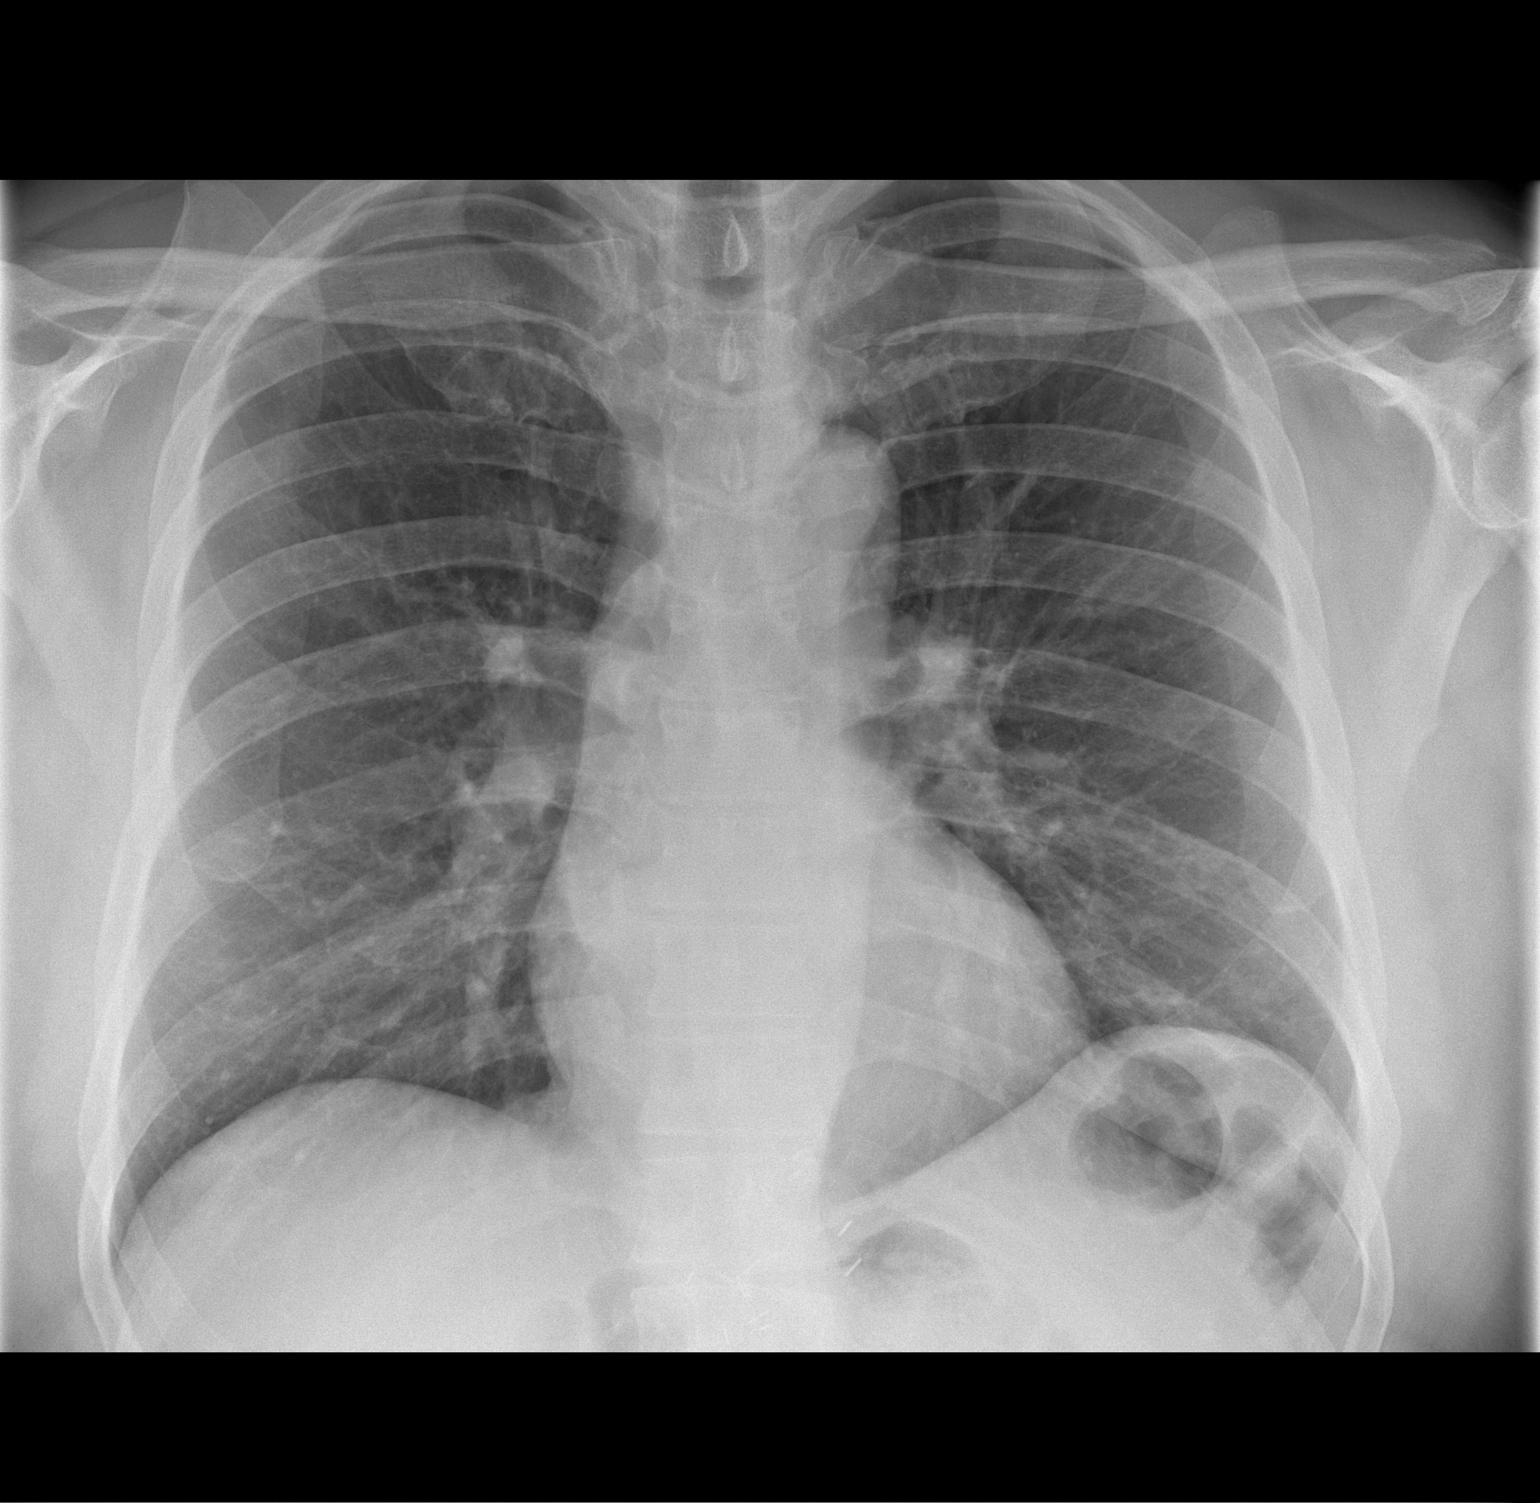

[w chest lat]
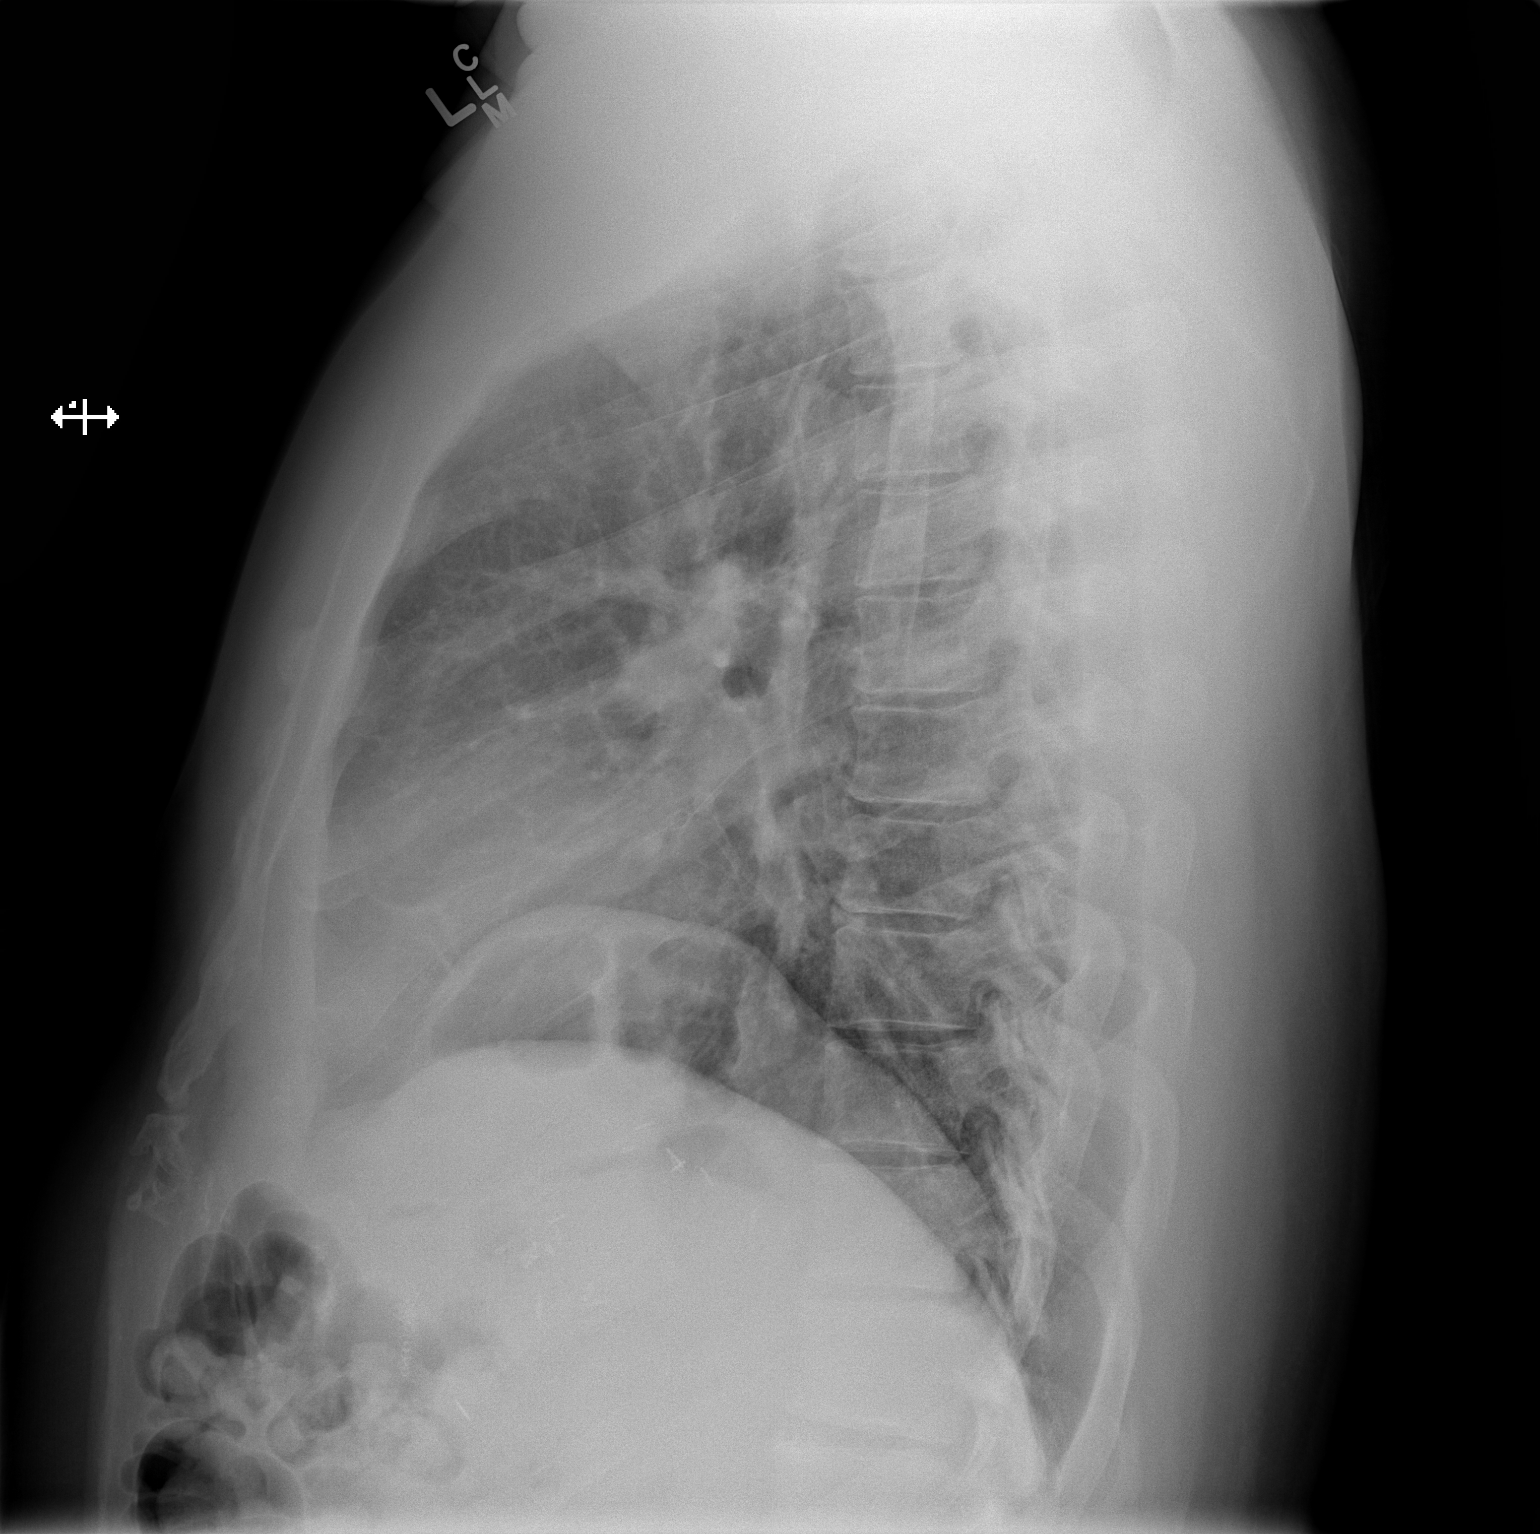

[2 of 2 positions shown; findings below may reference images not displayed]

FINDINGS: Stable elevation of the left hemidiaphragm.  Mild linear
lung base opacities.  Otherwise, the lungs are clear. No pleural
effusion or pneumothorax. The cardiomediastinal contours are within
normal limits. The visualized bones and soft tissues are without
significant appreciable abnormality.
IMPRESSION: No interval change.  No focal consolidation.

## 2015-05-10 ENCOUNTER — Encounter (HOSPITAL_COMMUNITY): Payer: Self-pay

## 2015-05-10 ENCOUNTER — Emergency Department (HOSPITAL_COMMUNITY)
Admission: EM | Admit: 2015-05-10 | Discharge: 2015-05-10 | Disposition: A | Discharge status: Home / Self Care | Payer: Commercial Managed Care - PPO | Attending: Emergency Medicine | Admitting: Emergency Medicine

## 2015-05-10 ENCOUNTER — Emergency Department (HOSPITAL_COMMUNITY): Payer: Commercial Managed Care - PPO

## 2015-05-10 DIAGNOSIS — M109 Gout, unspecified: Secondary | ICD-10-CM | POA: Diagnosis not present

## 2015-05-10 DIAGNOSIS — Z7982 Long term (current) use of aspirin: Secondary | ICD-10-CM | POA: Diagnosis not present

## 2015-05-10 DIAGNOSIS — Z791 Long term (current) use of non-steroidal anti-inflammatories (NSAID): Secondary | ICD-10-CM | POA: Insufficient documentation

## 2015-05-10 DIAGNOSIS — I10 Essential (primary) hypertension: Secondary | ICD-10-CM | POA: Diagnosis not present

## 2015-05-10 DIAGNOSIS — Z872 Personal history of diseases of the skin and subcutaneous tissue: Secondary | ICD-10-CM | POA: Diagnosis not present

## 2015-05-10 DIAGNOSIS — Z8669 Personal history of other diseases of the nervous system and sense organs: Secondary | ICD-10-CM | POA: Diagnosis not present

## 2015-05-10 DIAGNOSIS — M25572 Pain in left ankle and joints of left foot: Secondary | ICD-10-CM | POA: Insufficient documentation

## 2015-05-10 DIAGNOSIS — Z8719 Personal history of other diseases of the digestive system: Secondary | ICD-10-CM | POA: Insufficient documentation

## 2015-05-10 DIAGNOSIS — M25472 Effusion, left ankle: Secondary | ICD-10-CM | POA: Insufficient documentation

## 2015-05-10 DIAGNOSIS — M25571 Pain in right ankle and joints of right foot: Secondary | ICD-10-CM

## 2015-05-10 DIAGNOSIS — M25471 Effusion, right ankle: Secondary | ICD-10-CM

## 2015-05-10 DIAGNOSIS — Z79899 Other long term (current) drug therapy: Secondary | ICD-10-CM | POA: Insufficient documentation

## 2015-05-10 MED ORDER — TRAMADOL HCL 50 MG PO TABS: 50.0000 mg | ORAL_TABLET | Freq: Four times a day (QID) | ORAL | Status: DC | PRN

## 2015-05-10 NOTE — ED Notes (Signed)
Pt verbalized understanding of d/c instructions and has no further questions. Pt stable and NAD. Pt to follow up with ortho.

## 2015-05-10 NOTE — Discharge Instructions (Signed)
Continue the Advil, do not take the narcotic if driving as it will make you sleepy. Call Dr. Kathline Magic office for follow up of your ankle pain.  Return here as needed for worsening symptoms.   Ankle Pain Ankle pain is a common symptom. The bones, cartilage, tendons, and muscles of the ankle joint perform a lot of work each day. The ankle joint holds your body weight and allows you to move around. Ankle pain can occur on either side or back of 1 or both ankles. Ankle pain may be sharp and burning or dull and aching. There may be tenderness, stiffness, redness, or warmth around the ankle. The pain occurs more often when a person walks or puts pressure on the ankle. CAUSES  There are many reasons ankle pain can develop. It is important to work with your caregiver to identify the cause since many conditions can impact the bones, cartilage, muscles, and tendons. Causes for ankle pain include:  Injury, including a break (fracture), sprain, or strain often due to a fall, sports, or a high-impact activity.  Swelling (inflammation) of a tendon (tendonitis).  Achilles tendon rupture.  Ankle instability after repeated sprains and strains.  Poor foot alignment.  Pressure on a nerve (tarsal tunnel syndrome).  Arthritis in the ankle or the lining of the ankle.  Crystal formation in the ankle (gout or pseudogout). DIAGNOSIS  A diagnosis is based on your medical history, your symptoms, results of your physical exam, and results of diagnostic tests. Diagnostic tests may include X-ray exams or a computerized magnetic scan (magnetic resonance imaging, MRI). TREATMENT  Treatment will depend on the cause of your ankle pain and may include:  Keeping pressure off the ankle and limiting activities.  Using crutches or other walking support (a cane or brace).  Using rest, ice, compression, and elevation.  Participating in physical therapy or home exercises.  Wearing shoe inserts or special shoes.  Losing  weight.  Taking medications to reduce pain or swelling or receiving an injection.  Undergoing surgery. HOME CARE INSTRUCTIONS   Only take over-the-counter or prescription medicines for pain, discomfort, or fever as directed by your caregiver.  Put ice on the injured area.  Put ice in a plastic bag.  Place a towel between your skin and the bag.  Leave the ice on for 15-20 minutes at a time, 03-04 times a day.  Keep your leg raised (elevated) when possible to lessen swelling.  Avoid activities that cause ankle pain.  Follow specific exercises as directed by your caregiver.  Record how often you have ankle pain, the location of the pain, and what it feels like. This information may be helpful to you and your caregiver.  Ask your caregiver about returning to work or sports and whether you should drive.  Follow up with your caregiver for further examination, therapy, or testing as directed. SEEK MEDICAL CARE IF:   Pain or swelling continues or worsens beyond 1 week.  You have an oral temperature above 102 F (38.9 C).  You are feeling unwell or have chills.  You are having an increasingly difficult time with walking.  You have loss of sensation or other new symptoms.  You have questions or concerns. MAKE SURE YOU:   Understand these instructions.  Will watch your condition.  Will get help right away if you are not doing well or get worse.   This information is not intended to replace advice given to you by your health care provider. Make sure you  discuss any questions you have with your health care provider.   Document Released: 06/07/2009 Document Revised: 03/12/2011 Document Reviewed: 07/20/2014 Elsevier Interactive Patient Education [employer]2016 Elsevier Inc.

## 2015-05-10 NOTE — ED Provider Notes (Signed)
CSN: [id number]     Arrival date & time 05/10/15  1939 History  By signing my name below, I, Bethel BornBritney McCollum, attest that this documentation has been prepared under the direction and in the presence of [facility] Tyan Lasure NP. Electronically Signed: Bethel BornBritney McCollum, ED Scribe. 05/10/2015 8:47 PM   Chief Complaint  Patient presents with  . Ankle Pain    The history is provided by the patient. No language interpreter was used.   Verita LambMelvin Bink is a 60 y.o. male with PMHx of gout who presents to the Emergency Department complaining of new, constant, 8/10 in severity, sharp,  lateral left ankle pain with onset yesterday. Movement exacerbates the pain but he has been ambulatory. He took nothing for pain at home. Pt states that his gout is typically in the great toe and his most recent episode was 5 weeks ago. His PCP is Dr. Montez Moritaarter.   Past Medical History  Diagnosis Date  . HTN (hypertension)   . Edema   . Chest pain   . Paronychia   . Carpal tunnel syndrome of left wrist   . Gastric ulcer    Past Surgical History  Procedure Laterality Date  . Abdominal surgery    . Gastric ulcer repair     History reviewed. No pertinent family history. Social History  Substance Use Topics  . Smoking status: Never Smoker   . Smokeless tobacco: None  . Alcohol Use: No    Review of Systems  Musculoskeletal: Positive for joint swelling and arthralgias.  All other systems reviewed and are negative.  Allergies  Review of patient's allergies indicates no known allergies.  Home Medications   Prior to Admission medications   Medication Sig Start Date End Date Taking? Authorizing Provider  amLODipine (NORVASC) 10 MG tablet Take 10 mg by mouth daily.      Historical Provider, MD  aspirin 81 MG tablet Take 81 mg by mouth daily.      Historical Provider, MD  diazepam (VALIUM) 5 MG tablet Take 1 tablet (5 mg total) by mouth every 8 (eight) hours as needed (spasm). 03/12/12   Marisa Severinlga Otter, MD  lisinopril  (PRINIVIL,ZESTRIL) 20 MG tablet Take 20 mg by mouth daily.      Historical Provider, MD  metoprolol succinate (TOPROL-XL) 100 MG 24 hr tablet Take 100 mg by mouth daily. Take with or immediately following a meal.    Historical Provider, MD  naproxen (NAPROSYN) 250 MG tablet Take 1 tablet (250 mg total) by mouth 2 (two) times daily with a meal. 03/12/12   Marisa Severinlga Otter, MD  traMADol (ULTRAM) 50 MG tablet Take 1 tablet (50 mg total) by mouth every 6 (six) hours as needed. 05/10/15   Errica Dutil Orlene OchM Lillyian Heidt, NP   BP 185/101 mmHg  Pulse 81  Temp(Src) 98.1 F (36.7 C) (Oral)  Resp 16  SpO2 98% Physical Exam  Constitutional: He is oriented to person, place, and time. He appears well-developed and well-nourished.  HENT:  Head: Normocephalic and atraumatic.  Eyes: EOM are normal.  Neck: Normal range of motion. Neck supple.  Cardiovascular: Normal rate.   Pulmonary/Chest: Effort normal.  Abdominal: Soft.  Musculoskeletal:       Left ankle: He exhibits swelling. He exhibits no ecchymosis, no deformity, no laceration and normal pulse. Decreased range of motion: due to pain. Tenderness. Medial malleolus tenderness found. Achilles tendon normal.  LLE: Pedal pulses 2+. Adequate circulation. There is swelling to the medial and lateral malleolus.  Tenderness with ROM.  Equal strength  Slightly increased warmth but no erythema or red streaking.   Neurological: He is alert and oriented to person, place, and time. No cranial nerve deficit.  Skin: Skin is warm and dry.  Psychiatric: He has a normal mood and affect. His behavior is normal.  Nursing note and vitals reviewed.   ED Course  Procedures (including critical care time) DIAGNOSTIC STUDIES: Oxygen Saturation is 98% on RA,  normal by my interpretation.    COORDINATION OF CARE: 8:46 PM Discussed treatment plan which includes left ankle XR and pain medication with pt at bedside and pt agreed to plan.  Labs Review Labs Reviewed - No data to  display  Imaging Review Dg Ankle Complete Left  05/10/2015  CLINICAL DATA:  Anterolateral left ankle pain for 2 days. EXAM: LEFT ANKLE COMPLETE - 3+ VIEW COMPARISON:  None. FINDINGS: Soft tissue swelling overlying both malleoli, especially laterally. There is spurring of the medial malleolus and adjacent talus. No osteochondral lesion of the talar dome. Plantar and Achilles calcaneal spurs. Prominent dorsal spurring of the talar head and dorsal spurring in the midfoot. Poor definition of the posterior subtalar facet on the lateral projection may be projectional or due to talocalcaneal tarsal coalition. IMPRESSION: 1. Subcutaneous edema overlying both malleoli without a malleolar fracture identified. 2. Poor definition of the posterior subtalar facet on the lateral view may be projectional but there is also a dorsal talar beak. Both findings can be present in the setting of talocalcaneal tarsal coalition. This could be worked up with non emergent CT or MRI of the hindfoot, if clinically warranted. MRI could up for the additional advantage of assessing for anterolateral impingement and ligamentous injury. 3. Midfoot dorsal spurring, especially prominent along the navicular. 4. Plantar and Achilles calcaneal spurs. Electronically Signed   By: Gaylyn Rong M.D.   On: 05/10/2015 20:50   I have personally reviewed and evaluated these images as part of my medical decision-making. I discussed this case and reviewed the x-rays with Dr. Particia Nearing. Ankle air cast, ice, elevation and pain management and f/u with ortho. Patient reports he has crutches at home.   MDM  59 y.o. male with left ankle pain that started yesterday stable for d/c without focal neuro deficits. Discussed with the patient clinical and x-ray findings and plan of care. All questioned fully answered.  Final diagnoses:  Ankle pain, right  Ankle swelling, right   I personally performed the services described in this documentation, which was  scribed in my presence. The recorded information has been reviewed and is accurate.   Janne Napoleon, [place] 05/11/15 [phone]

## 2015-05-10 NOTE — ED Notes (Signed)
Pt from home..the patient states that he woke up yesterday morning with left ankle pain, pt states that it has gotten progressively worse since then. Pt denies injury. Pt able to ambulate but states it's hard to put weight on it. Swelling noted to left ankle. NAD

## 2015-07-20 NOTE — ED Provider Notes (Signed)
Medical screening examination/treatment/procedure(s) were performed by non-physician practitioner and as supervising physician I was immediately available for consultation/collaboration.   EKG Interpretation None       Delania Ferg, MD 07/20/15 1054 

## 2020-10-11 ENCOUNTER — Ambulatory Visit (HOSPITAL_COMMUNITY): Admit: 2020-10-11 | Discharge status: Absent without leave | Payer: Self-pay

## 2020-10-11 ENCOUNTER — Encounter (HOSPITAL_COMMUNITY): Payer: Self-pay | Admitting: Emergency Medicine

## 2020-10-11 ENCOUNTER — Other Ambulatory Visit: Payer: Self-pay

## 2020-10-11 ENCOUNTER — Ambulatory Visit (HOSPITAL_COMMUNITY)
Admission: EM | Admit: 2020-10-11 | Discharge: 2020-10-11 | Disposition: A | Discharge status: Home / Self Care | Payer: PPO | Attending: Urgent Care | Admitting: Urgent Care

## 2020-10-11 DIAGNOSIS — R0981 Nasal congestion: Secondary | ICD-10-CM | POA: Diagnosis not present

## 2020-10-11 DIAGNOSIS — I160 Hypertensive urgency: Secondary | ICD-10-CM

## 2020-10-11 DIAGNOSIS — R5381 Other malaise: Secondary | ICD-10-CM | POA: Diagnosis not present

## 2020-10-11 DIAGNOSIS — U071 COVID-19: Secondary | ICD-10-CM | POA: Insufficient documentation

## 2020-10-11 DIAGNOSIS — R519 Headache, unspecified: Secondary | ICD-10-CM | POA: Diagnosis not present

## 2020-10-11 DIAGNOSIS — J069 Acute upper respiratory infection, unspecified: Secondary | ICD-10-CM | POA: Diagnosis not present

## 2020-10-11 DIAGNOSIS — I10 Essential (primary) hypertension: Secondary | ICD-10-CM | POA: Diagnosis not present

## 2020-10-11 MED ORDER — IPRATROPIUM BROMIDE 0.03 % NA SOLN: 2.0000 | Freq: Two times a day (BID) | NASAL | 0 refills | Status: DC

## 2020-10-11 MED ORDER — CETIRIZINE HCL 10 MG PO TABS: 10.0000 mg | ORAL_TABLET | Freq: Every day | ORAL | 0 refills | Status: DC

## 2020-10-11 MED ORDER — PROMETHAZINE-DM 6.25-15 MG/5ML PO SYRP: 5.0000 mL | ORAL_SOLUTION | Freq: Every evening | ORAL | 0 refills | Status: DC | PRN

## 2020-10-11 NOTE — Discharge Instructions (Signed)
We will notify you of your COVID-19 test results as they arrive and may take between 48-72 hours.  I encourage you to sign up for MyChart if you have not already done so as this can be the easiest way for us to communicate results to you online or through a phone app.  Generally, we only contact you if it is a positive COVID result.  In the meantime, if you develop worsening symptoms including fever, chest pain, shortness of breath despite our current treatment plan then please report to the emergency room as this may be a sign of worsening status from possible COVID-19 infection.  Otherwise, we will manage this as a viral syndrome. For sore throat or cough try using a honey-based tea. Use 3 teaspoons of honey with juice squeezed from half lemon. Place shaved pieces of ginger into 1/2-1 cup of water and warm over stove top. Then mix the ingredients and repeat every 4 hours as needed. Please take Tylenol 500mg-650mg every 6 hours for aches and pains, fevers. Hydrate very well with at least 2 liters of water. Eat light meals such as soups to replenish electrolytes and soft fruits, veggies. Start an antihistamine like Zyrtec for postnasal drainage, sinus congestion.  

## 2020-10-11 NOTE — ED Provider Notes (Signed)
[facility]   MRN: [id number] DOB: 1955-04-17  Subjective:   Matthew Foster is a 65 y.o. male presenting for 1 day history of acute onset sinus congestion, postnasal drainage, cough, throat pain.  Patient reports frontal headache.  Has a history of hypertension, states that he is compliant with his medications.  No history of stroke, no chest pain, weakness, vision changes, numbness or tingling.  Patient is not a smoker.  No asthma.  No current facility-administered medications for this encounter.  Current Outpatient Medications:    amLODipine (NORVASC) 10 MG tablet, Take 10 mg by mouth daily.  , Disp: , Rfl:    aspirin 81 MG tablet, Take 81 mg by mouth daily.  , Disp: , Rfl:    diazepam (VALIUM) 5 MG tablet, Take 1 tablet (5 mg total) by mouth every 8 (eight) hours as needed (spasm)., Disp: 20 tablet, Rfl: 0   lisinopril (PRINIVIL,ZESTRIL) 20 MG tablet, Take 20 mg by mouth daily.  , Disp: , Rfl:    metoprolol succinate (TOPROL-XL) 100 MG 24 hr tablet, Take 100 mg by mouth daily. Take with or immediately following a meal., Disp: , Rfl:    naproxen (NAPROSYN) 250 MG tablet, Take 1 tablet (250 mg total) by mouth 2 (two) times daily with a meal., Disp: 30 tablet, Rfl: 0   traMADol (ULTRAM) 50 MG tablet, Take 1 tablet (50 mg total) by mouth every 6 (six) hours as needed., Disp: 15 tablet, Rfl: 0   No Known Allergies  Past Medical History:  Diagnosis Date   Carpal tunnel syndrome of left wrist    Chest pain    Edema    Gastric ulcer    HTN (hypertension)    Paronychia      Past Surgical History:  Procedure Laterality Date   ABDOMINAL SURGERY     gastric ulcer repair      No family history on file.  Social History   Tobacco Use   Smoking status: Never  Substance Use Topics   Alcohol use: No   Drug use: No    ROS   Objective:   Vitals: BP (!) 201/132 (BP Location: Left Arm)   Pulse 81   Temp 99.7 F (37.6 C) (Oral)   Resp 17   SpO2 97%    Physical Exam Constitutional:      General: He is not in acute distress.    Appearance: Normal appearance. He is well-developed and normal weight. He is not ill-appearing, toxic-appearing or diaphoretic.  HENT:     Head: Normocephalic and atraumatic.     Right Ear: Tympanic membrane, ear canal and external ear normal. There is no impacted cerumen.     Left Ear: Tympanic membrane, ear canal and external ear normal. There is no impacted cerumen.     Nose: Congestion and rhinorrhea present.     Mouth/Throat:     Mouth: Mucous membranes are moist.     Pharynx: Oropharynx is clear. No pharyngeal swelling, oropharyngeal exudate, posterior oropharyngeal erythema or uvula swelling.  Eyes:     General: No scleral icterus.       Right eye: No discharge.        Left eye: No discharge.     Extraocular Movements: Extraocular movements intact.     Conjunctiva/sclera: Conjunctivae normal.     Pupils: Pupils are equal, round, and reactive to light.  Cardiovascular:     Rate and Rhythm: Normal rate and regular rhythm.  Heart sounds: Normal heart sounds. No murmur heard.   No friction rub. No gallop.  Pulmonary:     Effort: Pulmonary effort is normal. No respiratory distress.     Breath sounds: Normal breath sounds. No stridor. No wheezing, rhonchi or rales.  Musculoskeletal:     Cervical back: Normal range of motion and neck supple. No rigidity. No muscular tenderness.  Neurological:     General: No focal deficit present.     Mental Status: He is alert and oriented to person, place, and time.     Cranial Nerves: No cranial nerve deficit, dysarthria or facial asymmetry.     Motor: No weakness.     Coordination: Romberg sign negative. Coordination normal.     Gait: Gait normal.  Psychiatric:        Mood and Affect: Mood normal.        Behavior: Behavior normal.        Thought Content: Thought content normal.    Assessment and Plan :   PDMP not reviewed this encounter.  1. Viral URI  with cough   2. Bad headache   3. Sinus congestion   4. Malaise   5. Essential hypertension   6. Hypertensive urgency     Suspect his headache is not related to his blood pressure but cannot rule this out definitively.  Maintain strict ER precautions.  Otherwise will manage for sinus headache or a viral respiratory illness.  COVID-19 testing pending.  Recommend supportive care. Counseled patient on potential for adverse effects with medications prescribed/recommended today, ER and return-to-clinic precautions discussed, patient verbalized understanding.    Wallis Bamberg, PA-C 10/11/20 1949

## 2020-10-11 NOTE — ED Triage Notes (Signed)
Headache , cough, sore throat that started last night when got up for work and felt worse as day progressed.

## 2020-10-12 LAB — SARS CORONAVIRUS 2 (TAT 6-24 HRS): SARS Coronavirus 2: POSITIVE — AB

## 2020-10-21 ENCOUNTER — Other Ambulatory Visit: Payer: Self-pay

## 2020-10-21 ENCOUNTER — Ambulatory Visit (HOSPITAL_COMMUNITY)
Admission: EM | Admit: 2020-10-21 | Discharge: 2020-10-21 | Disposition: A | Discharge status: Home / Self Care | Payer: PPO | Attending: Physician Assistant | Admitting: Physician Assistant

## 2020-10-21 ENCOUNTER — Encounter (HOSPITAL_COMMUNITY): Payer: Self-pay

## 2020-10-21 DIAGNOSIS — M25562 Pain in left knee: Secondary | ICD-10-CM | POA: Diagnosis not present

## 2020-10-21 DIAGNOSIS — M79605 Pain in left leg: Secondary | ICD-10-CM | POA: Diagnosis not present

## 2020-10-21 DIAGNOSIS — Z8739 Personal history of other diseases of the musculoskeletal system and connective tissue: Secondary | ICD-10-CM | POA: Diagnosis not present

## 2020-10-21 DIAGNOSIS — Z8616 Personal history of COVID-19: Secondary | ICD-10-CM

## 2020-10-21 MED ORDER — PREDNISONE 10 MG (21) PO TBPK: ORAL_TABLET | ORAL | 0 refills | Status: DC

## 2020-10-21 NOTE — Discharge Instructions (Signed)
I have called in prednisone to treat what I suspect is gout.  Please do not take NSAIDs including aspirin, ibuprofen/Advil, naproxen/Aleve with this medication as can cause stomach bleeding.  Please go get ultrasound tomorrow to rule out a blood clot as we discussed.  If you have any worsening symptoms including chest pain, leg swelling, increased pain, shortness of breath, heart racing you need to go to the emergency room overnight.

## 2020-10-21 NOTE — ED Triage Notes (Signed)
Pt presents with L knee pain X 2 days. States it worsened over night.

## 2020-10-21 NOTE — ED Provider Notes (Signed)
[facility]    CSN: [id number] Arrival date & time: 10/21/20  1416      History   Chief Complaint Chief Complaint  Patient presents with   Gout    HPI Matthew Foster is a 65 y.o. male.   Patient presents today with a 2-day history of left knee pain.  He denies any known injury or increase in activity prior to symptom onset.  Reports pain is rated 7 on a 0-10 pain scale, localized to left knee with radiation into left leg, described as aching/throbbing, no aggravating relieving factors identified.  He is having difficulty with daily activities including pushing in the clutch on his truck due to pain.  He does have a history of gout which often presents the knee and states current symptoms are similar to previous episodes of this condition.  He has not had surgery or injury to this knee in the past.  Denies any numbness or tingling in left foot.  He does report recently being diagnosed and recovering from COVID-19.  Denies any history of DVT.  Denies any recent hospitalization, recent surgery, exogenous hormone use.  He denies any medication changes or dietary changes prior to symptom onset.  Denies any increase in alcohol consumption.   Past Medical History:  Diagnosis Date   Carpal tunnel syndrome of left wrist    Chest pain    Edema    Gastric ulcer    HTN (hypertension)    Paronychia     Patient Active Problem List   Diagnosis Date Noted   Chest pain 10/06/2010   HTN (hypertension) 10/06/2010   Abnormal ECG 10/06/2010    Past Surgical History:  Procedure Laterality Date   ABDOMINAL SURGERY     gastric ulcer repair         Home Medications    Prior to Admission medications   Medication Sig Start Date End Date Taking? Authorizing Provider  predniSONE (STERAPRED UNI-PAK 21 TAB) 10 MG (21) TBPK tablet As directed 10/21/20  Yes Tywon Niday K, PA-C  amLODipine (NORVASC) 10 MG tablet Take 10 mg by mouth daily.      [provider]  aspirin 81 MG  tablet Take 81 mg by mouth daily.      [provider]  cetirizine (ZYRTEC ALLERGY) 10 MG tablet Take 1 tablet (10 mg total) by mouth daily. 10/11/20   Wallis Bamberg, PA-C  diazepam (VALIUM) 5 MG tablet Take 1 tablet (5 mg total) by mouth every 8 (eight) hours as needed (spasm). 03/12/12   Marisa Severin, MD  ipratropium (ATROVENT) 0.03 % nasal spray Place 2 sprays into both nostrils 2 (two) times daily. 10/11/20   Wallis Bamberg, PA-C  lisinopril (PRINIVIL,ZESTRIL) 20 MG tablet Take 20 mg by mouth daily.      [provider]  metoprolol succinate (TOPROL-XL) 100 MG 24 hr tablet Take 100 mg by mouth daily. Take with or immediately following a meal.    [provider]  naproxen (NAPROSYN) 250 MG tablet Take 1 tablet (250 mg total) by mouth 2 (two) times daily with a meal. 03/12/12   Marisa Severin, MD  promethazine-dextromethorphan (PROMETHAZINE-DM) 6.25-15 MG/5ML syrup Take 5 mLs by mouth at bedtime as needed for cough. 10/11/20   Wallis Bamberg, PA-C  traMADol (ULTRAM) 50 MG tablet Take 1 tablet (50 mg total) by mouth every 6 (six) hours as needed. 05/10/15   Janne Napoleon, NP    Family History History reviewed. No pertinent family history.  Social  History Social History   Tobacco Use   Smoking status: Never   Smokeless tobacco: Never  Vaping Use   Vaping Use: Never used  Substance Use Topics   Alcohol use: No   Drug use: No     Allergies   Patient has no known allergies.   Review of Systems Review of Systems  Constitutional:  Positive for activity change. Negative for appetite change, fatigue and fever.  Respiratory:  Negative for cough and shortness of breath.   Cardiovascular:  Negative for chest pain, palpitations and leg swelling.  Gastrointestinal:  Negative for abdominal pain, diarrhea, nausea and vomiting.  Musculoskeletal:  Positive for arthralgias, gait problem, joint swelling and myalgias.  Neurological:  Negative for dizziness, weakness, light-headedness,  numbness and headaches.    Physical Exam Triage Vital Signs ED Triage Vitals  Enc Vitals Group     BP 10/21/20 1551 (!) 142/88     Pulse Rate 10/21/20 1550 63     Resp 10/21/20 1550 19     Temp 10/21/20 1550 98.3 F (36.8 C)     Temp Source 10/21/20 1550 Oral     SpO2 10/21/20 1550 97 %     Weight --      Height --      Head Circumference --      Peak Flow --      Pain Score 10/21/20 1549 6     Pain Loc --      Pain Edu? --      Excl. in GC? --    No data found.  Updated Vital Signs BP (!) 142/88   Pulse 63   Temp 98.3 F (36.8 C) (Oral)   Resp 19   SpO2 97%   Visual Acuity Right Eye Distance:   Left Eye Distance:   Bilateral Distance:    Right Eye Near:   Left Eye Near:    Bilateral Near:     Physical Exam Vitals reviewed.  Constitutional:      General: He is awake.     Appearance: Normal appearance. He is well-developed. He is not ill-appearing.     Comments: Very pleasant male appears stated age in no acute distress sitting comfortably in exam room  HENT:     Head: Normocephalic and atraumatic.  Cardiovascular:     Rate and Rhythm: Normal rate and regular rhythm.     Heart sounds: Normal heart sounds, S1 normal and S2 normal. No murmur heard. Pulmonary:     Effort: Pulmonary effort is normal.     Breath sounds: Normal breath sounds. No stridor. No wheezing, rhonchi or rales.     Comments: Clear auscultation bilaterally Abdominal:     General: Bowel sounds are normal.     Palpations: Abdomen is soft.     Tenderness: There is no abdominal tenderness.  Musculoskeletal:     Left knee: Swelling present. No effusion. Decreased range of motion. Tenderness present over the medial joint line and lateral joint line. No LCL laxity, MCL laxity, ACL laxity or PCL laxity.    Right lower leg: No edema.     Left lower leg: No tenderness or bony tenderness. No edema.     Comments: Left knee: Decreased into motion with flexion secondary to pain.  Tenderness palpation  over inferior joint line.  No ligamentous laxity on exam.  No palpable cords.  No Homans' sign.  Neurological:     Mental Status: He is alert.  Psychiatric:  Behavior: Behavior is cooperative.     UC Treatments / Results  Labs (all labs ordered are listed, but only abnormal results are displayed) Labs Reviewed - No data to display  EKG   Radiology No results found.  Procedures Procedures (including critical care time)  Medications Ordered in UC Medications - No data to display  Initial Impression / Assessment and Plan / UC Course  I have reviewed the triage vital signs and the nursing notes.  Pertinent labs & imaging results that were available during my care of the patient were reviewed by me and considered in my medical decision making (see chart for details).     No indication for x-ray given no recent trauma or bony tenderness on exam.  Suspect gout as etiology of symptoms.  Patient has been taking ibuprofen without improvement of symptoms we will treat with prednisone; denies history of diabetes.  He was instructed not to take NSAIDs with this medication due to risk of GI bleeding.  Recommended conservative treatment measures including elevation.  Given pain involves left leg patient recently recovered from COVID-19 will obtain ultrasound to rule out DVT though low suspicion for this.  Discussed that if he has any worsening symptoms including chest pain, shortness of breath, leg swelling, palpitations he needs to go to the emergency room.  Strict return precautions given to which she expressed understanding.  Final Clinical Impressions(s) / UC Diagnoses   Final diagnoses:  Acute pain of left knee  Left leg pain  History of gout  History of COVID-19     Discharge Instructions      I have called in prednisone to treat what I suspect is gout.  Please do not take NSAIDs including aspirin, ibuprofen/Advil, naproxen/Aleve with this medication as can cause stomach  bleeding.  Please go get ultrasound tomorrow to rule out a blood clot as we discussed.  If you have any worsening symptoms including chest pain, leg swelling, increased pain, shortness of breath, heart racing you need to go to the emergency room overnight.     ED Prescriptions     Medication Sig Dispense Auth. Provider   predniSONE (STERAPRED UNI-PAK 21 TAB) 10 MG (21) TBPK tablet As directed 21 tablet Meshell Abdulaziz K, PA-C      PDMP not reviewed this encounter.   Jeani Hawking, PA-C 10/21/20 1612

## 2020-10-22 ENCOUNTER — Ambulatory Visit (HOSPITAL_COMMUNITY)
Admission: RE | Admit: 2020-10-22 | Discharge: 2020-10-22 | Disposition: A | Discharge status: Home / Self Care | Payer: PPO | Source: Ambulatory Visit | Attending: Physician Assistant | Admitting: Physician Assistant

## 2020-10-22 DIAGNOSIS — M79662 Pain in left lower leg: Secondary | ICD-10-CM | POA: Diagnosis not present

## 2020-10-22 DIAGNOSIS — M7989 Other specified soft tissue disorders: Secondary | ICD-10-CM

## 2020-10-22 DIAGNOSIS — U071 COVID-19: Secondary | ICD-10-CM

## 2020-10-22 DIAGNOSIS — M25562 Pain in left knee: Secondary | ICD-10-CM

## 2020-10-22 DIAGNOSIS — Z8616 Personal history of COVID-19: Secondary | ICD-10-CM | POA: Diagnosis not present

## 2020-10-22 NOTE — Progress Notes (Signed)
Lower extremity venous LT study completed.   Please see CV Proc for preliminary results.   Kandee Escalante, RDMS, RVT  

## 2020-11-19 ENCOUNTER — Ambulatory Visit (HOSPITAL_COMMUNITY)
Admission: EM | Admit: 2020-11-19 | Discharge: 2020-11-19 | Disposition: A | Discharge status: Home / Self Care | Payer: PPO | Attending: Student | Admitting: Student

## 2020-11-19 ENCOUNTER — Other Ambulatory Visit: Payer: Self-pay

## 2020-11-19 ENCOUNTER — Encounter (HOSPITAL_COMMUNITY): Payer: Self-pay | Admitting: Emergency Medicine

## 2020-11-19 ENCOUNTER — Ambulatory Visit (INDEPENDENT_AMBULATORY_CARE_PROVIDER_SITE_OTHER): Payer: PPO

## 2020-11-19 DIAGNOSIS — M7989 Other specified soft tissue disorders: Secondary | ICD-10-CM | POA: Diagnosis not present

## 2020-11-19 DIAGNOSIS — M10061 Idiopathic gout, right knee: Secondary | ICD-10-CM | POA: Diagnosis not present

## 2020-11-19 DIAGNOSIS — M25561 Pain in right knee: Secondary | ICD-10-CM | POA: Diagnosis not present

## 2020-11-19 HISTORY — DX: Gout, unspecified: M10.9

## 2020-11-19 MED ORDER — PREDNISONE 10 MG (21) PO TBPK: ORAL_TABLET | ORAL | 0 refills | Status: DC

## 2020-11-19 NOTE — ED Triage Notes (Signed)
Patient complains of right knee pain.  Has a history of gout and arthritis.  Pain started Monday.  Today pain is extreme

## 2020-11-19 NOTE — Discharge Instructions (Addendum)
-  Your xray looks normal. This confirms our diagnosis of gout.  -Prednisone sent -Limit red meat, seafood, alcohol/beer -Follow-up if symptoms worsen/persist

## 2020-11-19 NOTE — ED Provider Notes (Signed)
[facility]    CSN: [id number] Arrival date & time: 11/19/20  1020      History   Chief Complaint Chief Complaint  Patient presents with   Knee Pain    HPI Jacquis Paxton is a 65 y.o. male presenting with right knee pain.  Medical history gout, arthritis.  He was last at our urgent care on 10/20 for left knee gout, this improved following prednisone.  Endorses 4 days of severe right knee pain, nontraumatic.  Advil provides temporary relief, he did take 600 mg of this this morning.  Denies trauma or overuse.  Denies red meat, seafood, beer.  HPI  Past Medical History:  Diagnosis Date   Carpal tunnel syndrome of left wrist    Chest pain    Edema    Gastric ulcer    Gout    HTN (hypertension)    Paronychia     Patient Active Problem List   Diagnosis Date Noted   Chest pain 10/06/2010   HTN (hypertension) 10/06/2010   Abnormal ECG 10/06/2010    Past Surgical History:  Procedure Laterality Date   ABDOMINAL SURGERY     gastric ulcer repair         Home Medications    Prior to Admission medications   Medication Sig Start Date End Date Taking? Authorizing Provider  amLODipine (NORVASC) 10 MG tablet Take 10 mg by mouth daily.      [provider]  aspirin 81 MG tablet Take 81 mg by mouth daily.      [provider]  cetirizine (ZYRTEC ALLERGY) 10 MG tablet Take 1 tablet (10 mg total) by mouth daily. Patient not taking: Reported on 11/19/2020 10/11/20   Wallis Bamberg, PA-C  diazepam (VALIUM) 5 MG tablet Take 1 tablet (5 mg total) by mouth every 8 (eight) hours as needed (spasm). Patient not taking: Reported on 11/19/2020 03/12/12   Marisa Severin, MD  ipratropium (ATROVENT) 0.03 % nasal spray Place 2 sprays into both nostrils 2 (two) times daily. 10/11/20   Wallis Bamberg, PA-C  lisinopril (PRINIVIL,ZESTRIL) 20 MG tablet Take 20 mg by mouth daily.      [provider]  metoprolol succinate (TOPROL-XL) 100 MG 24 hr tablet Take 100 mg by  mouth daily. Take with or immediately following a meal.    [provider]  naproxen (NAPROSYN) 250 MG tablet Take 1 tablet (250 mg total) by mouth 2 (two) times daily with a meal. Patient not taking: Reported on 11/19/2020 03/12/12   Marisa Severin, MD  predniSONE (STERAPRED UNI-PAK 21 TAB) 10 MG (21) TBPK tablet As directed- 6 pills day 1, 5 pills day 2, etc. 11/19/20   Rhys Martini, PA-C  promethazine-dextromethorphan (PROMETHAZINE-DM) 6.25-15 MG/5ML syrup Take 5 mLs by mouth at bedtime as needed for cough. Patient not taking: Reported on 11/19/2020 10/11/20   Wallis Bamberg, PA-C  traMADol (ULTRAM) 50 MG tablet Take 1 tablet (50 mg total) by mouth every 6 (six) hours as needed. Patient not taking: Reported on 11/19/2020 05/10/15   Janne Napoleon, NP    Family History Family History  Problem Relation Age of Onset   Diabetes Mother    Diabetes Father     Social History Social History   Tobacco Use   Smoking status: Never   Smokeless tobacco: Never  Vaping Use   Vaping Use: Never used  Substance Use Topics   Alcohol use: No   Drug use: No     Allergies  Patient has no known allergies.   Review of Systems Review of Systems  Musculoskeletal:        R knee pain  All other systems reviewed and are negative.   Physical Exam Triage Vital Signs ED Triage Vitals  Enc Vitals Group     BP 11/19/20 1053 (!) 145/86     Pulse Rate 11/19/20 1053 68     Resp 11/19/20 1053 20     Temp 11/19/20 1053 98.4 F (36.9 C)     Temp Source 11/19/20 1053 Oral     SpO2 11/19/20 1053 96 %     Weight --      Height --      Head Circumference --      Peak Flow --      Pain Score 11/19/20 1049 10     Pain Loc --      Pain Edu? --      Excl. in GC? --    No data found.  Updated Vital Signs BP (!) 145/86 (BP Location: Left Arm) Comment (BP Location): large cuff  Pulse 68   Temp 98.4 F (36.9 C) (Oral)   Resp 20   SpO2 96%   Visual Acuity Right Eye Distance:   Left Eye  Distance:   Bilateral Distance:    Right Eye Near:   Left Eye Near:    Bilateral Near:     Physical Exam Vitals reviewed.  Constitutional:      General: He is not in acute distress.    Appearance: Normal appearance. He is not ill-appearing.  HENT:     Head: Normocephalic and atraumatic.  Pulmonary:     Effort: Pulmonary effort is normal.  Musculoskeletal:     Comments: R knee- effusion and tenderness diffusely. Warm. No erythema. Extension limited due to pain. No calf tenderness, calves and thighs are equal and symmetric. No joint laxity. DP 2+.  Neurological:     General: No focal deficit present.     Mental Status: He is alert and oriented to person, place, and time.  Psychiatric:        Mood and Affect: Mood normal.        Behavior: Behavior normal.        Thought Content: Thought content normal.        Judgment: Judgment normal.     UC Treatments / Results  Labs (all labs ordered are listed, but only abnormal results are displayed) Labs Reviewed - No data to display  EKG   Radiology DG Knee 2 Views Right  Result Date: 11/19/2020 CLINICAL DATA:  Knee pain and swelling. EXAM: RIGHT KNEE - 1-2 VIEW COMPARISON:  None. FINDINGS: No fracture. No subluxation or dislocation. No substantial joint effusion. Trace spurring noted patellofemoral compartment. No worrisome lytic or sclerotic osseous abnormality. IMPRESSION: Negative. Electronically Signed   By: Kennith Center M.D.   On: 11/19/2020 11:33    Procedures Procedures (including critical care time)  Medications Ordered in UC Medications - No data to display  Initial Impression / Assessment and Plan / UC Course  I have reviewed the triage vital signs and the nursing notes.  Pertinent labs & imaging results that were available during my care of the patient were reviewed by me and considered in my medical decision making (see chart for details).     This patient is a very pleasant 66 y.o. year old male presenting  with gout R knee. Afebrile, nontachy.  Given severity of discomfort, did check  a xray R knee- negative.  Prednisone sent as below.  Initially planned to treat with Toradol IM today, but he has taken Advil 600mg  few hours ago, so ultimately deferred this. Tylenol for pain.  Rec low-purine diet.  ED return precautions discussed. Patient verbalizes understanding and agreement.     Final Clinical Impressions(s) / UC Diagnoses   Final diagnoses:  Acute idiopathic gout of right knee     Discharge Instructions      -Your xray looks normal. This confirms our diagnosis of gout.  -Prednisone sent -Limit red meat, seafood, alcohol/beer -Follow-up if symptoms worsen/persist     ED Prescriptions     Medication Sig Dispense Auth. Provider   predniSONE (STERAPRED UNI-PAK 21 TAB) 10 MG (21) TBPK tablet As directed- 6 pills day 1, 5 pills day 2, etc. 21 tablet , PA-C      PDMP not reviewed this encounter.   Rhys Martini, PA-C 11/19/20 1140

## 2020-11-25 ENCOUNTER — Encounter (HOSPITAL_COMMUNITY): Payer: Self-pay | Admitting: Emergency Medicine

## 2020-11-25 ENCOUNTER — Ambulatory Visit (HOSPITAL_COMMUNITY)
Admission: EM | Admit: 2020-11-25 | Discharge: 2020-11-25 | Disposition: A | Discharge status: Home / Self Care | Payer: PPO | Attending: Internal Medicine | Admitting: Internal Medicine

## 2020-11-25 ENCOUNTER — Other Ambulatory Visit: Payer: Self-pay

## 2020-11-25 DIAGNOSIS — M109 Gout, unspecified: Secondary | ICD-10-CM | POA: Insufficient documentation

## 2020-11-25 LAB — CBC WITH DIFFERENTIAL/PLATELET
Abs Immature Granulocytes: 0.2 10*3/uL — ABNORMAL HIGH (ref 0.00–0.07)
Basophils Absolute: 0.1 10*3/uL (ref 0.0–0.1)
Basophils Relative: 1 %
Eosinophils Absolute: 0.3 10*3/uL (ref 0.0–0.5)
Eosinophils Relative: 2 %
HCT: 45.7 % (ref 39.0–52.0)
Hemoglobin: 14.6 g/dL (ref 13.0–17.0)
Immature Granulocytes: 2 %
Lymphocytes Relative: 24 %
Lymphs Abs: 3.1 10*3/uL (ref 0.7–4.0)
MCH: 27.2 pg (ref 26.0–34.0)
MCHC: 31.9 g/dL (ref 30.0–36.0)
MCV: 85.3 fL (ref 80.0–100.0)
Monocytes Absolute: 1 10*3/uL (ref 0.1–1.0)
Monocytes Relative: 8 %
Neutro Abs: 8.3 10*3/uL — ABNORMAL HIGH (ref 1.7–7.7)
Neutrophils Relative %: 63 %
Platelets: 448 10*3/uL — ABNORMAL HIGH (ref 150–400)
RBC: 5.36 MIL/uL (ref 4.22–5.81)
RDW: 13 % (ref 11.5–15.5)
WBC: 13 10*3/uL — ABNORMAL HIGH (ref 4.0–10.5)
nRBC: 0 % (ref 0.0–0.2)

## 2020-11-25 LAB — URIC ACID: Uric Acid, Serum: 8.6 mg/dL (ref 3.7–8.6)

## 2020-11-25 MED ORDER — METHYLPREDNISOLONE SODIUM SUCC 125 MG IJ SOLR
INTRAMUSCULAR | Status: AC
  Filled 2020-11-25: qty 2

## 2020-11-25 MED ORDER — ALLOPURINOL 100 MG PO TABS: 200.0000 mg | ORAL_TABLET | Freq: Every day | ORAL | 2 refills | Status: DC

## 2020-11-25 MED ORDER — METHYLPREDNISOLONE SODIUM SUCC 125 MG IJ SOLR
125.0000 mg | Freq: Once | INTRAMUSCULAR | Status: AC
  Administered 2020-11-25: 125 mg via INTRAMUSCULAR

## 2020-11-25 MED ORDER — METHYLPREDNISOLONE 4 MG PO TABS: ORAL_TABLET | ORAL | 0 refills | Status: AC

## 2020-11-25 NOTE — ED Triage Notes (Signed)
Pt presents with right knee pain. States felt better with prednisone but pain as increased after complete prednisone.

## 2020-11-25 NOTE — ED Provider Notes (Signed)
[facility]    CSN: [id number] Arrival date & time: 11/25/20  1816    HISTORY   Chief Complaint  Patient presents with   Knee Pain    right   HPI Matthew Foster is a 65 y.o. male. States that he was here 7 days ago, diagnosed with gout and treated with tapering dose of steroids.  Patient states that the first 2 days he began to feel better however the pain gradually returned, patient states today the pain is worse than it was when he started.  Patient reports a history of gout in the past, states this is the first gout flare he has had in the last 6 months.  Patient states he used to take allopurinol but discontinued it several months ago when he was stopped having recurrent flares.  Patient states he strictly follows a purine free diet.  Patient states he does not understand why he is having a gout flare at this time.  EMR reviewed, x-ray performed a week ago was normal and not concerning for arthritis or any other pathology.  The history is provided by the patient.  Past Medical History:  Diagnosis Date   Carpal tunnel syndrome of left wrist    Chest pain    Edema    Gastric ulcer    Gout    HTN (hypertension)    Paronychia    Patient Active Problem List   Diagnosis Date Noted   Chest pain 10/06/2010   HTN (hypertension) 10/06/2010   Abnormal ECG 10/06/2010   Past Surgical History:  Procedure Laterality Date   ABDOMINAL SURGERY     gastric ulcer repair      Home Medications    Prior to Admission medications   Medication Sig Start Date End Date Taking? Authorizing Provider  allopurinol (ZYLOPRIM) 100 MG tablet Take 2 tablets (200 mg total) by mouth daily. 11/25/20 02/23/21 Yes Theadora Rama Scales, PA-C  methylPREDNISolone (MEDROL) 4 MG tablet Take 8 tablets (32 mg total) by mouth daily for 2 days, THEN 7 tablets (28 mg total) daily for 2 days, THEN 6 tablets (24 mg total) daily for 2 days, THEN 5 tablets (20 mg total) daily for 2 days, THEN 4 tablets (16  mg total) daily for 2 days, THEN 3 tablets (12 mg total) daily for 2 days, THEN 2 tablets (8 mg total) daily for 2 days, THEN 1 tablet (4 mg total) daily for 2 days. 11/25/20 12/11/20 Yes Theadora Rama Scales, PA-C  amLODipine (NORVASC) 10 MG tablet Take 10 mg by mouth daily.      [provider]  aspirin 81 MG tablet Take 81 mg by mouth daily.      [provider]  ipratropium (ATROVENT) 0.03 % nasal spray Place 2 sprays into both nostrils 2 (two) times daily. 10/11/20   Wallis Bamberg, PA-C  lisinopril (PRINIVIL,ZESTRIL) 20 MG tablet Take 20 mg by mouth daily.      [provider]  metoprolol succinate (TOPROL-XL) 100 MG 24 hr tablet Take 100 mg by mouth daily. Take with or immediately following a meal.    [provider]   Family History Family History  Problem Relation Age of Onset   Diabetes Mother    Diabetes Father    Social History Social History   Tobacco Use   Smoking status: Never   Smokeless tobacco: Never  Vaping Use   Vaping Use: Never used  Substance Use Topics   Alcohol use: No   Drug use:  No   Allergies   Patient has no known allergies.  Review of Systems Review of Systems Pertinent findings noted in history of present illness.   Physical Exam Triage Vital Signs ED Triage Vitals  Enc Vitals Group     BP 10/28/20 0827 (!) 147/82     Pulse Rate 10/28/20 0827 72     Resp 10/28/20 0827 18     Temp 10/28/20 0827 98.3 F (36.8 C)     Temp Source 10/28/20 0827 Oral     SpO2 10/28/20 0827 98 %     Weight --      Height --      Head Circumference --      Peak Flow --      Pain Score 10/28/20 0826 5     Pain Loc --      Pain Edu? --      Excl. in GC? --   No data found.  Updated Vital Signs BP (!) 174/99 (BP Location: Left Arm)   Pulse 68   Temp 98.2 F (36.8 C) (Oral)   Resp 17   SpO2 96%   Physical Exam  Visual Acuity Right Eye Distance:   Left Eye Distance:   Bilateral Distance:    Right Eye Near:   Left  Eye Near:    Bilateral Near:     UC Couse / Diagnostics / Procedures:    EKG  Radiology No results found.  Procedures Procedures (including critical care time)  UC Diagnoses / Final Clinical Impressions(s)    Final diagnoses:  Gout of right knee, unspecified cause, unspecified chronicity   I have reviewed the triage vital signs and the nursing notes.  Pertinent labs & imaging results that were available during my care of the patient were reviewed by me and considered in my medical decision making (see chart for details).    Patient was provided with an increased dose and longer duration of steroids to treat his rebound gout flare.  Uric acid level, CBC and metabolic panel were obtained.  Patient advised to follow-up with primary care provider for further management of his gout but certainly to follow-up sooner if he does not show significant improvement in the next 7 days.  Patient/parent/caregiver verbalized understanding and agreement of plan as discussed.  All questions were addressed during visit.  Please see discharge instructions below for further details of plan.  ED Prescriptions     Medication Sig Dispense Auth. Provider   methylPREDNISolone (MEDROL) 4 MG tablet Take 8 tablets (32 mg total) by mouth daily for 2 days, THEN 7 tablets (28 mg total) daily for 2 days, THEN 6 tablets (24 mg total) daily for 2 days, THEN 5 tablets (20 mg total) daily for 2 days, THEN 4 tablets (16 mg total) daily for 2 days, THEN 3 tablets (12 mg total) daily for 2 days, THEN 2 tablets (8 mg total) daily for 2 days, THEN 1 tablet (4 mg total) daily for 2 days. 72 tablet Theadora Rama Scales, PA-C   allopurinol (ZYLOPRIM) 100 MG tablet Take 2 tablets (200 mg total) by mouth daily. 60 tablet Theadora Rama Scales, PA-C      PDMP not reviewed this encounter.  Pending results:  Labs Reviewed  URIC ACID  CBC WITH DIFFERENTIAL/PLATELET  BASIC METABOLIC PANEL     Medications Ordered in  UC: Medications  methylPREDNISolone sodium succinate (SOLU-MEDROL) 125 mg/2 mL injection 125 mg (125 mg Intramuscular Given 11/25/20 1953)    Discharge Instructions:  Discharge Instructions      You received an injection of methylprednisolone in the office today, this should significantly address the inflammation in your knee.  Tomorrow morning, please begin your first dose of methylprednisolone tablets with your breakfast, please take them all exactly as prescribed.  In 1 week, if you have not had significant relief of your symptoms and are not continuing to show improvement, please either reach out to your primary care provider or return to urgent care for repeat evaluation.  You will receive the results of your blood tests done today as soon as they are complete, this typically takes a day or so.  The results are initially posted to your MyChart account and if they are abnormal you will be contacted by phone.  Any further recommendations or treatment is needed will be provided to you.  Also provided you with a renewal of your prescription for allopurinol, please take 1 tablet daily starting when your right knee pain has completely resolved.      Disposition Upon Discharge:  Patient presented with an acute illness with associated systemic symptoms and significant discomfort requiring urgent management. In my opinion, this is a condition that a prudent lay person (someone who possesses an average knowledge of health and medicine) may potentially expect to result in complications if not addressed urgently such as respiratory distress, impairment of bodily function or dysfunction of bodily organs.   Routine symptom specific, illness specific and/or disease specific instructions were discussed with the patient and/or caregiver at length.   As such, the patient has been evaluated and assessed, work-up was performed and treatment was provided in alignment with urgent care protocols and  evidence based medicine.  Patient/parent/caregiver has been advised that the patient may require follow up for further testing and treatment if the symptoms continue in spite of treatment, as clinically indicated and appropriate.  If the patient was tested for COVID-19, Influenza and/or RSV, then the patient/parent/guardian was advised to isolate at home pending the results of his/her diagnostic coronavirus test and potentially longer if they're positive. I have also advised pt that if his/her COVID-19 test returns positive, it's recommended to self-isolate for at least 10 days after symptoms first appeared AND until fever-free for 24 hours without fever reducer AND other symptoms have improved or resolved. Discussed self-isolation recommendations as well as instructions for household member/close contacts as per the [facility] and  [facility], and also gave patient the COVID packet with this information.  Patient/parent/caregiver has been advised to return to the [facility] or PCP in 3-5 days if no better; to PCP or the Emergency Department if new signs and symptoms develop, or if the current signs or symptoms continue to change or worsen for further workup, evaluation and treatment as clinically indicated and appropriate  The patient will follow up with their current PCP if and as advised. If the patient does not currently have a PCP we will assist them in obtaining one.   The patient may need specialty follow up if the symptoms continue, in spite of conservative treatment and management, for further workup, evaluation, consultation and treatment as clinically indicated and appropriate.  Condition: stable for discharge home Home: take medications as prescribed; routine discharge instructions as discussed; follow up as advised.    Theadora Rama Scales, PA-C 11/25/20 1956

## 2020-11-25 NOTE — Discharge Instructions (Addendum)
You received an injection of methylprednisolone in the office today, this should significantly address the inflammation in your knee.  Tomorrow morning, please begin your first dose of methylprednisolone tablets with your breakfast, please take them all exactly as prescribed.  In 1 week, if you have not had significant relief of your symptoms and are not continuing to show improvement, please either reach out to your primary care provider or return to urgent care for repeat evaluation.  You will receive the results of your blood tests done today as soon as they are complete, this typically takes a day or so.  The results are initially posted to your MyChart account and if they are abnormal you will be contacted by phone.  Any further recommendations or treatment is needed will be provided to you.  Also provided you with a renewal of your prescription for allopurinol, please take 1 tablet daily starting when your right knee pain has completely resolved.

## 2021-02-21 DIAGNOSIS — R079 Chest pain, unspecified: Secondary | ICD-10-CM | POA: Diagnosis not present

## 2021-02-21 DIAGNOSIS — I10 Essential (primary) hypertension: Secondary | ICD-10-CM | POA: Diagnosis not present

## 2021-02-21 DIAGNOSIS — Z0000 Encounter for general adult medical examination without abnormal findings: Secondary | ICD-10-CM | POA: Diagnosis not present

## 2021-03-10 DIAGNOSIS — G4730 Sleep apnea, unspecified: Secondary | ICD-10-CM | POA: Diagnosis not present

## 2021-03-10 DIAGNOSIS — E785 Hyperlipidemia, unspecified: Secondary | ICD-10-CM | POA: Diagnosis not present

## 2021-03-10 DIAGNOSIS — R9431 Abnormal electrocardiogram [ECG] [EKG]: Secondary | ICD-10-CM | POA: Diagnosis not present

## 2021-03-10 DIAGNOSIS — I10 Essential (primary) hypertension: Secondary | ICD-10-CM | POA: Diagnosis not present

## 2021-03-10 DIAGNOSIS — R079 Chest pain, unspecified: Secondary | ICD-10-CM | POA: Diagnosis not present

## 2021-05-19 DIAGNOSIS — R9431 Abnormal electrocardiogram [ECG] [EKG]: Secondary | ICD-10-CM | POA: Diagnosis not present

## 2021-05-19 DIAGNOSIS — R079 Chest pain, unspecified: Secondary | ICD-10-CM | POA: Diagnosis not present

## 2021-06-15 ENCOUNTER — Encounter (HOSPITAL_COMMUNITY): Payer: Self-pay

## 2021-06-15 ENCOUNTER — Ambulatory Visit (INDEPENDENT_AMBULATORY_CARE_PROVIDER_SITE_OTHER): Payer: PPO

## 2021-06-15 ENCOUNTER — Ambulatory Visit (HOSPITAL_COMMUNITY)
Admission: RE | Admit: 2021-06-15 | Discharge: 2021-06-15 | Disposition: A | Discharge status: Home / Self Care | Payer: PPO | Source: Ambulatory Visit | Attending: Internal Medicine | Admitting: Internal Medicine

## 2021-06-15 VITALS — BP 139/77 | HR 61 | Temp 98.7°F | Resp 17

## 2021-06-15 DIAGNOSIS — M25472 Effusion, left ankle: Secondary | ICD-10-CM | POA: Diagnosis not present

## 2021-06-15 DIAGNOSIS — M25572 Pain in left ankle and joints of left foot: Secondary | ICD-10-CM | POA: Diagnosis not present

## 2021-06-15 DIAGNOSIS — M7989 Other specified soft tissue disorders: Secondary | ICD-10-CM | POA: Diagnosis not present

## 2021-06-15 DIAGNOSIS — M19072 Primary osteoarthritis, left ankle and foot: Secondary | ICD-10-CM | POA: Diagnosis not present

## 2021-06-15 DIAGNOSIS — M25772 Osteophyte, left ankle: Secondary | ICD-10-CM | POA: Diagnosis not present

## 2021-06-15 DIAGNOSIS — M7732 Calcaneal spur, left foot: Secondary | ICD-10-CM | POA: Diagnosis not present

## 2021-06-15 MED ORDER — MELOXICAM 7.5 MG PO TABS: 7.5000 mg | ORAL_TABLET | Freq: Every day | ORAL | 0 refills | Status: DC

## 2021-06-15 MED ORDER — KETOROLAC TROMETHAMINE 30 MG/ML IJ SOLN
INTRAMUSCULAR | Status: AC
  Filled 2021-06-15: qty 1

## 2021-06-15 MED ORDER — KETOROLAC TROMETHAMINE 30 MG/ML IJ SOLN
15.0000 mg | Freq: Once | INTRAMUSCULAR | Status: AC
  Administered 2021-06-15: 15 mg via INTRAMUSCULAR

## 2021-06-15 NOTE — Discharge Instructions (Addendum)
Your pain and swelling is likely musculoskeletal.  We gave you an injection of anti-inflammatory medicine today.  Start taking meloxicam once daily to decrease inflammation and swelling starting tomorrow.  Do not take any tonight since I gave you the ketorolac injection in clinic today.   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  Follow-up with [facility] if needed for worsening symptoms.  If you develop any new or worsening symptoms or do not improve in the next 2 to 3 days, please return.  If your symptoms are severe, please go to the emergency room.  Follow-up with your primary care provider for further evaluation and management of your symptoms as well as ongoing wellness visits.  I hope you feel better!

## 2021-06-15 NOTE — ED Triage Notes (Signed)
Pt c/o left ankle pain for 2 weeks. Pain worse with weight bearing. Denies falls or injury.

## 2021-06-15 NOTE — ED Provider Notes (Signed)
[facility]    CSN: [id number] Arrival date & time: 06/15/21  1245      History   Chief Complaint Chief Complaint  Patient presents with  . Ankle Pain    Ankle hurts when I put pressure on it or when I try to wiggle my foot, making it difficult to walk! Feeling a burning tingling sensation in my foot as well - Entered by patient    HPI Matthew Foster is a 66 y.o. male.   Patient presents urgent care for evaluation of his left ankle pain that is worse with standing.  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  Pain started 2 weeks ago suddenly and has worsened progressively since then.  He works 5 days a week and has been taking Advil 400 mg intermittently to help with the pain when he is driving trucks.  Currently rates left ankle pain at a 5 on a scale of 0-10 and the pain is most significant to the medial malleolus and worse with twisting motion.  Left ankle became swollen on Wednesday, June 07, 2021 approximately 1 week ago.  Swelling is most significant where he is the most tender to the inferior medial malleolus.  He has not had any Advil since a couple of days ago and has also been taking Tylenol as needed for pain but states that the Tylenol has not been helping very much.  He states that his providers in the past have told him to use Advil sparingly due to being on blood pressure medication and possible kidney problems.     Ankle Pain   Past Medical History:  Diagnosis Date  . Carpal tunnel syndrome of left wrist   . Chest pain   . Edema   . Gastric ulcer   . Gout   . HTN (hypertension)   . Paronychia     Patient Active Problem List   Diagnosis Date Noted  . Chest pain 10/06/2010  . HTN (hypertension) 10/06/2010  . Abnormal ECG 10/06/2010    Past Surgical History:  Procedure Laterality Date  . ABDOMINAL  SURGERY    . gastric ulcer repair         Home Medications    Prior to Admission medications   Medication Sig Start Date End Date Taking? Authorizing Provider  allopurinol (ZYLOPRIM) 100 MG tablet Take 2 tablets (200 mg total) by mouth daily. 11/25/20 02/23/21  Theadora Rama Scales, PA-C  amLODipine (NORVASC) 10 MG tablet Take 10 mg by mouth daily.      [provider]  aspirin 81 MG tablet Take 81 mg by mouth daily.      [provider]  ipratropium (ATROVENT) 0.03 % nasal spray Place 2 sprays into both nostrils 2 (two) times daily. 10/11/20   Wallis Bamberg, PA-C  lisinopril (PRINIVIL,ZESTRIL) 20 MG tablet Take 20 mg by mouth daily.      [provider]  metoprolol succinate (TOPROL-XL) 100 MG 24 hr tablet Take 100 mg by mouth daily. Take with or immediately following a meal.    [provider]    Family History Family History  Problem Relation Age of Onset  . Diabetes Mother   . Diabetes Father     Social History Social History   Tobacco Use  . Smoking status: Never  .  Smokeless tobacco: Never  Vaping Use  . Vaping Use: Never used  Substance Use Topics  . Alcohol use: No  . Drug use: No     Allergies   Patient has no known allergies.   Review of Systems Review of Systems Per HPI  Physical Exam Triage Vital Signs ED Triage Vitals  Enc Vitals Group     BP 06/15/21 1321 139/77     Pulse Rate 06/15/21 1321 61     Resp 06/15/21 1321 17     Temp 06/15/21 1321 98.7 F (37.1 C)     Temp Source 06/15/21 1321 Oral     SpO2 06/15/21 1321 98 %     Weight --      Height --      Head Circumference --      Peak Flow --      Pain Score 06/15/21 1319 7     Pain Loc --      Pain Edu? --      Excl. in GC? --    No data found.  Updated Vital Signs BP 139/77 (BP Location: Right Arm)   Pulse 61   Temp 98.7 F (37.1 C) (Oral)   Resp 17   SpO2 98%   Visual Acuity Right Eye Distance:   Left Eye Distance:   Bilateral  Distance:    Right Eye Near:   Left Eye Near:    Bilateral Near:     Physical Exam Vitals and nursing note reviewed.  Constitutional:      General: He is not in acute distress.    Appearance: Normal appearance. He is well-developed. He is not ill-appearing.  HENT:     Head: Normocephalic and atraumatic.     Right Ear: External ear normal.     Left Ear: External ear normal.     Nose: Nose normal. No rhinorrhea.     Mouth/Throat:     Mouth: Mucous membranes are moist.     Pharynx: No posterior oropharyngeal erythema.  Eyes:     General: Lids are normal. Vision grossly intact. Gaze aligned appropriately.     Extraocular Movements: Extraocular movements intact.     Conjunctiva/sclera: Conjunctivae normal.     Right eye: Right conjunctiva is not injected.     Left eye: Left conjunctiva is not injected.  Cardiovascular:     Rate and Rhythm: Normal rate and regular rhythm.     Heart sounds: Normal heart sounds, S1 normal and S2 normal.  Pulmonary:     Effort: Pulmonary effort is normal.     Breath sounds: Normal breath sounds. No decreased air movement. No wheezing.  Abdominal:     Palpations: Abdomen is soft.  Musculoskeletal:     Cervical back: Neck supple.  Lymphadenopathy:     Cervical: No cervical adenopathy.  Skin:    General: Skin is warm and dry.     Capillary Refill: Capillary refill takes less than 2 seconds.     Findings: No rash.  Neurological:     General: No focal deficit present.     Mental Status: He is alert and oriented to person, place, and time. Mental status is at baseline.     Sensory: No sensory deficit.     Gait: Gait is intact.  Psychiatric:        Attention and Perception: Attention and perception normal.        Mood and Affect: Mood normal.        Speech: Speech normal.  Behavior: Behavior normal. Behavior is cooperative.        Thought Content: Thought content normal.        Cognition and Memory: Cognition and memory normal.         Judgment: Judgment normal.     UC Treatments / Results  Labs (all labs ordered are listed, but only abnormal results are displayed) Labs Reviewed - No data to display  EKG   Radiology No results found.  Procedures Procedures (including critical care time)  Medications Ordered in UC Medications  ketorolac (TORADOL) 30 MG/ML injection 15 mg (has no administration in time range)    Initial Impression / Assessment and Plan / UC Course  I have reviewed the triage vital signs and the nursing notes.  Pertinent labs & imaging results that were available during my care of the patient were reviewed by me and considered in my medical decision making (see chart for details).     *** Final Clinical Impressions(s) / UC Diagnoses   Final diagnoses:  None   Discharge Instructions   None    ED Prescriptions   None    PDMP not reviewed this encounter.

## 2021-07-10 DIAGNOSIS — E669 Obesity, unspecified: Secondary | ICD-10-CM | POA: Diagnosis not present

## 2021-07-10 DIAGNOSIS — E785 Hyperlipidemia, unspecified: Secondary | ICD-10-CM | POA: Diagnosis not present

## 2021-07-10 DIAGNOSIS — I10 Essential (primary) hypertension: Secondary | ICD-10-CM | POA: Diagnosis not present

## 2021-07-10 DIAGNOSIS — G4730 Sleep apnea, unspecified: Secondary | ICD-10-CM | POA: Diagnosis not present

## 2022-01-07 ENCOUNTER — Ambulatory Visit
Admission: EM | Admit: 2022-01-07 | Discharge: 2022-01-07 | Disposition: A | Discharge status: Home / Self Care | Payer: PPO | Attending: Urgent Care | Admitting: Urgent Care

## 2022-01-07 ENCOUNTER — Ambulatory Visit (HOSPITAL_COMMUNITY)
Admission: EM | Admit: 2022-01-07 | Discharge: 2022-01-07 | Disposition: A | Discharge status: Home / Self Care | Payer: PPO

## 2022-01-07 DIAGNOSIS — Z1152 Encounter for screening for COVID-19: Secondary | ICD-10-CM | POA: Insufficient documentation

## 2022-01-07 DIAGNOSIS — B349 Viral infection, unspecified: Secondary | ICD-10-CM | POA: Diagnosis not present

## 2022-01-07 DIAGNOSIS — R197 Diarrhea, unspecified: Secondary | ICD-10-CM

## 2022-01-07 DIAGNOSIS — I10 Essential (primary) hypertension: Secondary | ICD-10-CM | POA: Insufficient documentation

## 2022-01-07 DIAGNOSIS — R110 Nausea: Secondary | ICD-10-CM | POA: Insufficient documentation

## 2022-01-07 MED ORDER — ONDANSETRON 8 MG PO TBDP: 8.0000 mg | ORAL_TABLET | Freq: Three times a day (TID) | ORAL | 0 refills | Status: DC | PRN

## 2022-01-07 MED ORDER — LOPERAMIDE HCL 2 MG PO CAPS: 2.0000 mg | ORAL_CAPSULE | Freq: Two times a day (BID) | ORAL | 0 refills | Status: DC | PRN

## 2022-01-07 NOTE — Discharge Instructions (Signed)
Make sure you push fluids drinking mostly water but mix it with Gatorade.  Try to eat light meals including soups, broths and soft foods, fruits.  You may use Zofran for your nausea and vomiting once every 8 hours.  Imodium can help with diarrhea but use this carefully limiting it to 1-2 times per day only if you are having a lot of diarrhea.  Please return to the clinic if symptoms worsen or you start having severe abdominal pain not helped by taking Tylenol or start having bloody stools or blood in the vomit.  We will notify you of your test results as they arrive and may take between about 24 hours.  I encourage you to sign up for MyChart if you have not already done so as this can be the easiest way for [place] to communicate results to you online or through a phone app.  Generally, we only contact you if it is a positive test result.  In the meantime, if you develop worsening symptoms including fever, chest pain, shortness of breath despite our current treatment plan then please report to the emergency room as this may be a sign of worsening status from possible viral infection.  Otherwise, we will manage this as a viral syndrome. For sore throat or cough try using a honey-based tea. Use 3 teaspoons of honey with juice squeezed from half lemon. Place shaved pieces of ginger into 1/2-1 cup of water and warm over stove top. Then mix the ingredients and repeat every 4 hours as needed. Please take Tylenol 500mg -650mg  every 6 hours for aches and pains, fevers. Hydrate very well with at least 2 liters of water. Eat light meals such as soups to replenish electrolytes and soft fruits, veggies. Start an antihistamine like Zyrtec for postnasal drainage, sinus congestion.  You can take this with over-the-counter cough medications as needed.

## 2022-01-07 NOTE — ED Triage Notes (Signed)
Pt c/o mid abd pain, n/d x 3 days-NAD-steady gait

## 2022-01-07 NOTE — ED Provider Notes (Signed)
[facility]  Note:  This document was prepared using Systems analyst and may include unintentional dictation errors.  MRN: [id number] DOB: 1955/02/02  Subjective:   Jacobey Gura is a 67 y.o. male presenting for 3 day history of nausea without vomiting, diarrhea, belly pain, weakness, shob. Has had difficulty keeping foods in his system and he has regular diarrhea. No fever, bloody stools, recent antibiotic use, hospitalizations or long distance travel.  Has not eaten raw foods, drank unfiltered water.  No history of GI disorders including Crohn's, IBS, ulcerative colitis. Has HTN, did not take his blood pressure meds today.  No cough, headache, confusion, weakness, runny or stuffy nose, sore throat.  No current facility-administered medications for this encounter.  Current Outpatient Medications:    allopurinol (ZYLOPRIM) 100 MG tablet, Take 2 tablets (200 mg total) by mouth daily., Disp: 60 tablet, Rfl: 2   amLODipine (NORVASC) 10 MG tablet, Take 10 mg by mouth daily.  , Disp: , Rfl:    aspirin 81 MG tablet, Take 81 mg by mouth daily.  , Disp: , Rfl:    ipratropium (ATROVENT) 0.03 % nasal spray, Place 2 sprays into both nostrils 2 (two) times daily., Disp: 30 mL, Rfl: 0   lisinopril (PRINIVIL,ZESTRIL) 20 MG tablet, Take 20 mg by mouth daily.  , Disp: , Rfl:    meloxicam (MOBIC) 7.5 MG tablet, Take 1 tablet (7.5 mg total) by mouth daily., Disp: 30 tablet, Rfl: 0   metoprolol succinate (TOPROL-XL) 100 MG 24 hr tablet, Take 100 mg by mouth daily. Take with or immediately following a meal., Disp: , Rfl:    No Known Allergies  Past Medical History:  Diagnosis Date   Carpal tunnel syndrome of left wrist    Chest pain    Edema    Gastric ulcer    Gout    HTN (hypertension)    Paronychia      Past Surgical History:  Procedure Laterality Date   ABDOMINAL SURGERY     gastric ulcer repair      Family History  Problem Relation Age of Onset    Diabetes Mother    Diabetes Father     Social History   Tobacco Use   Smoking status: Never   Smokeless tobacco: Never  Vaping Use   Vaping Use: Never used  Substance Use Topics   Alcohol use: No   Drug use: No    ROS   Objective:   Vitals: BP (!) 174/106 (BP Location: Right Arm)   Pulse 74   Temp 98 F (36.7 C) (Oral)   Resp 20   SpO2 93%   Physical Exam Constitutional:      General: He is not in acute distress.    Appearance: Normal appearance. He is well-developed and normal weight. He is not ill-appearing, toxic-appearing or diaphoretic.  HENT:     Head: Normocephalic and atraumatic.     Right Ear: External ear normal.     Left Ear: External ear normal.     Nose: Nose normal.     Mouth/Throat:     Mouth: Mucous membranes are dry.  Eyes:     General: No scleral icterus.       Right eye: No discharge.        Left eye: No discharge.     Extraocular Movements: Extraocular movements intact.  Cardiovascular:     Rate and Rhythm: Normal rate and regular rhythm.     Heart sounds:  Normal heart sounds. No murmur heard.    No friction rub. No gallop.  Pulmonary:     Effort: Pulmonary effort is normal. No respiratory distress.     Breath sounds: Normal breath sounds. No stridor. No wheezing, rhonchi or rales.  Abdominal:     General: Bowel sounds are increased. There is no distension.     Palpations: Abdomen is soft. There is no mass.     Tenderness: There is abdominal tenderness. There is no right CVA tenderness, left CVA tenderness, guarding or rebound.  Musculoskeletal:     Cervical back: Normal range of motion.  Neurological:     Mental Status: He is alert and oriented to person, place, and time.     Cranial Nerves: No cranial nerve deficit.     Motor: No weakness.     Coordination: Coordination normal.     Gait: Gait normal.     Comments: No facial asymmetry, negative Romberg and pronator drift.  Psychiatric:        Mood and Affect: Mood normal.         Behavior: Behavior normal.        Thought Content: Thought content normal.        Judgment: Judgment normal.     Assessment and Plan :   PDMP not reviewed this encounter.  1. Acute viral syndrome   2. Nausea without vomiting   3. Diarrhea, unspecified type   4. Essential hypertension    Suspected acute viral syndrome, gastroenteritis, colitis.  COVID testing is pending.  Recommended supportive care, Zofran and loperamide, push fluids.  Tylenol for aches and pains.  Emphasized need to maintain blood pressure medications.  No signs of an acute abdomen. Counseled patient on potential for adverse effects with medications prescribed/recommended today, ER and return-to-clinic precautions discussed, patient verbalized understanding.    Wallis Bamberg, PA-C 01/07/22 1438

## 2022-01-08 LAB — SARS CORONAVIRUS 2 (TAT 6-24 HRS): SARS Coronavirus 2: NEGATIVE

## 2022-01-15 DIAGNOSIS — Z6836 Body mass index (BMI) 36.0-36.9, adult: Secondary | ICD-10-CM | POA: Diagnosis not present

## 2022-01-15 DIAGNOSIS — G4733 Obstructive sleep apnea (adult) (pediatric): Secondary | ICD-10-CM | POA: Diagnosis not present

## 2022-01-15 DIAGNOSIS — E785 Hyperlipidemia, unspecified: Secondary | ICD-10-CM | POA: Diagnosis not present

## 2022-01-15 DIAGNOSIS — I10 Essential (primary) hypertension: Secondary | ICD-10-CM | POA: Diagnosis not present

## 2022-01-15 DIAGNOSIS — E669 Obesity, unspecified: Secondary | ICD-10-CM | POA: Diagnosis not present

## 2022-04-08 ENCOUNTER — Encounter (HOSPITAL_COMMUNITY): Payer: Self-pay | Admitting: Emergency Medicine

## 2022-04-08 ENCOUNTER — Other Ambulatory Visit: Payer: Self-pay

## 2022-04-08 ENCOUNTER — Emergency Department (HOSPITAL_BASED_OUTPATIENT_CLINIC_OR_DEPARTMENT_OTHER): Payer: PPO

## 2022-04-08 ENCOUNTER — Ambulatory Visit
Admission: EM | Admit: 2022-04-08 | Discharge: 2022-04-08 | Disposition: A | Discharge status: Other Acute Inpatient Hospital | Payer: PPO

## 2022-04-08 ENCOUNTER — Emergency Department (HOSPITAL_COMMUNITY): Payer: PPO

## 2022-04-08 ENCOUNTER — Emergency Department (HOSPITAL_COMMUNITY)
Admission: EM | Admit: 2022-04-08 | Discharge: 2022-04-08 | Disposition: A | Discharge status: Home / Self Care | Payer: PPO | Attending: Student | Admitting: Student

## 2022-04-08 DIAGNOSIS — M7989 Other specified soft tissue disorders: Secondary | ICD-10-CM

## 2022-04-08 DIAGNOSIS — R52 Pain, unspecified: Secondary | ICD-10-CM | POA: Diagnosis not present

## 2022-04-08 DIAGNOSIS — I10 Essential (primary) hypertension: Secondary | ICD-10-CM | POA: Diagnosis not present

## 2022-04-08 DIAGNOSIS — Z79899 Other long term (current) drug therapy: Secondary | ICD-10-CM | POA: Insufficient documentation

## 2022-04-08 DIAGNOSIS — M79605 Pain in left leg: Secondary | ICD-10-CM

## 2022-04-08 DIAGNOSIS — M79662 Pain in left lower leg: Secondary | ICD-10-CM | POA: Diagnosis not present

## 2022-04-08 MED ORDER — NAPROXEN 375 MG PO TABS: 375.0000 mg | ORAL_TABLET | Freq: Two times a day (BID) | ORAL | 0 refills | Status: DC

## 2022-04-08 NOTE — Discharge Instructions (Signed)
Please go to the emergency department for further evaluation and management as I am concerned that you may have a DVT.

## 2022-04-08 NOTE — ED Notes (Signed)
Pt c/o inner left knee pain radiating down leg. Sensation and pulses intact. Pt has hx of gout and arthritis but states the pain feels different.

## 2022-04-08 NOTE — ED Notes (Signed)
Patient is being discharged from the Urgent Care and sent to the Emergency Department via POV . Per HM, patient is in need of higher level of care due to leg swelling. Patient is aware and verbalizes understanding of plan of care.  Vitals:   04/08/22 1056  BP: (!) 162/94  Pulse: 62  Resp: 18  Temp: 98.4 F (36.9 C)  SpO2: 96%

## 2022-04-08 NOTE — ED Triage Notes (Signed)
Patient arrives ambulatory by POV c/o left leg pain starting at inside of knee radiating down his leg. Denies any trauma to leg. Denies any shortness of breath. Pain worse when putting pressure on it.

## 2022-04-08 NOTE — ED Provider Notes (Signed)
[facility] Provider Note  CSN: [id number] Arrival date & time: 04/08/22 1131  Chief Complaint(s) Leg Pain  HPI Matthew Foster is a 67 y.o. male with PMH gout, GERD, gastric ulcer, carpal tunnel syndrome who presents emergency room for evaluation of left leg pain and swelling.  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  Denies numbness, tingling, weakness of lower extremity.  Denies trauma to the leg.  Patient does have a history of gout but states that this feels different.  No fever, chest pain, shortness of breath, Donnell pain, nausea, vomiting or other systemic symptoms.  Patient went to urgent care who transferred the patient to the emergency department for DVT ultrasound study.   Past Medical History Past Medical History:  Diagnosis Date   Carpal tunnel syndrome of left wrist    Chest pain    Edema    Gastric ulcer    Gout    HTN (hypertension)    Paronychia    Patient Active Problem List   Diagnosis Date Noted   Chest pain 10/06/2010   HTN (hypertension) 10/06/2010   Abnormal ECG 10/06/2010   Home Medication(s) Prior to Admission medications   Medication Sig Start Date End Date Taking? Authorizing Provider  naproxen (NAPROSYN) 375 MG tablet Take 1 tablet (375 mg total) by mouth 2 (two) times daily. 04/08/22  Yes Ralph Brouwer, MD  allopurinol (ZYLOPRIM) 100 MG tablet Take 2 tablets (200 mg total) by mouth daily. 11/25/20 02/23/21  Theadora Rama Scales, PA-C  amLODipine (NORVASC) 10 MG tablet Take 10 mg by mouth daily.      [provider]  aspirin 81 MG tablet Take 81 mg by mouth daily.      [provider]  ipratropium (ATROVENT) 0.03 % nasal spray Place 2 sprays into both nostrils 2 (two) times daily. 10/11/20   Wallis Bamberg, PA-C  lisinopril (PRINIVIL,ZESTRIL) 20 MG tablet Take 20 mg by mouth daily.      [provider]  loperamide (IMODIUM) 2 MG capsule Take 1 capsule (2 mg total) by mouth 2 (two) times daily as needed for diarrhea or loose stools. 01/07/22   Wallis Bamberg, PA-C  metoprolol succinate (TOPROL-XL) 100 MG 24 hr tablet Take 100 mg by mouth daily. Take with or immediately following a meal.    [provider]  ondansetron (ZOFRAN-ODT) 8 MG disintegrating tablet Take 1 tablet (8 mg total) by mouth every 8 (eight) hours as needed for nausea or vomiting. 01/07/22   Wallis Bamberg, PA-C                                                                                                                                    Past Surgical History Past Surgical History:  Procedure Laterality Date   ABDOMINAL SURGERY  gastric ulcer repair     Family History Family History  Problem Relation Age of Onset   Diabetes Mother    Diabetes Father     Social History Social History   Tobacco Use   Smoking status: Never   Smokeless tobacco: Never  Vaping Use   Vaping Use: Never used  Substance Use Topics   Alcohol use: No   Drug use: No   Allergies Patient has no known allergies.  Review of Systems Review of Systems  Musculoskeletal:  Positive for arthralgias and myalgias.    Physical Exam Vital Signs  I have reviewed the triage vital signs BP 131/64 (BP Location: Right Arm)   Pulse (!) 51   Temp 97.9 F (36.6 C) (Oral)   Resp 18   Ht 6' 3.5" (1.918 m)   Wt 125.6 kg   SpO2 97%   BMI 34.17 kg/m   Physical Exam Constitutional:      General: He is not in acute distress.    Appearance: Normal appearance.  HENT:     Head: Normocephalic and atraumatic.     Nose: No congestion or rhinorrhea.  Eyes:     General:        Right eye: No discharge.        Left eye: No discharge.     Extraocular Movements: Extraocular movements intact.     Pupils: Pupils are equal, round, and reactive to light.  Cardiovascular:     Rate and Rhythm: Normal rate and regular rhythm.     Heart sounds: No  murmur heard. Pulmonary:     Effort: No respiratory distress.     Breath sounds: No wheezing or rales.  Abdominal:     General: There is no distension.     Tenderness: There is no abdominal tenderness.  Musculoskeletal:        General: Swelling and tenderness present. Normal range of motion.     Cervical back: Normal range of motion.  Skin:    General: Skin is warm and dry.  Neurological:     General: No focal deficit present.     Mental Status: He is alert.     ED Results and Treatments Labs (all labs ordered are listed, but only abnormal results are displayed) Labs Reviewed - No data to display                                                                                                                        Radiology VAS [place] LOWER EXTREMITY VENOUS (DVT) (7a-7p)  Result Date: 04/08/2022  Lower Venous DVT Study Patient Name:  Matthew Foster  Date of Exam:   04/08/2022 Medical Rec #: [id number]      Accession #:    [id number] Date of Birth: 1955/11/13      Patient Gender: M Patient Age:   37 years Exam Location:  [facility] Procedure:      VAS [place] LOWER EXTREMITY VENOUS (DVT) Referring Phys: Kendi Defalco [facility] --------------------------------------------------------------------------------  Indications: Pain, and Swelling left knee.  Comparison Study: Prior negative left LEV done 10/22/20 Performing Technologist: Sherren Kerns RVS  Examination Guidelines: A complete evaluation includes B-mode imaging, spectral Doppler, color Doppler, and power Doppler as needed of all accessible portions of each vessel. Bilateral testing is considered an integral part of a complete examination. Limited examinations for reoccurring indications may be performed as noted. The reflux portion of the exam is performed with the patient in reverse Trendelenburg.  +-----+---------------+---------+-----------+----------+--------------+ RIGHTCompressibilityPhasicitySpontaneityPropertiesThrombus Aging  +-----+---------------+---------+-----------+----------+--------------+ CFV  Full           Yes      Yes                                 +-----+---------------+---------+-----------+----------+--------------+   +---------+---------------+---------+-----------+----------+--------------+ LEFT     CompressibilityPhasicitySpontaneityPropertiesThrombus Aging +---------+---------------+---------+-----------+----------+--------------+ CFV      Full           Yes      Yes                                 +---------+---------------+---------+-----------+----------+--------------+ SFJ      Full                                                        +---------+---------------+---------+-----------+----------+--------------+ FV Prox  Full                                                        +---------+---------------+---------+-----------+----------+--------------+ FV Mid   Full           Yes      Yes                                 +---------+---------------+---------+-----------+----------+--------------+ FV DistalFull                                                        +---------+---------------+---------+-----------+----------+--------------+ PFV      Full                                                        +---------+---------------+---------+-----------+----------+--------------+ POP      Full           Yes      Yes                                 +---------+---------------+---------+-----------+----------+--------------+ PTV      Full                                                        +---------+---------------+---------+-----------+----------+--------------+  PERO     Full                                                        +---------+---------------+---------+-----------+----------+--------------+ Gastroc  Full                                                         +---------+---------------+---------+-----------+----------+--------------+     Summary: RIGHT: - No evidence of common femoral vein obstruction.  LEFT: - No evidence of deep vein thrombosis in the lower extremity. No indirect evidence of obstruction proximal to the inguinal ligament. - No cystic structure found in the popliteal fossa.  *See table(s) above for measurements and observations. Electronically signed by Matthew Ferrari MD on 04/08/2022 at 2:22:11 PM.    Final    DG Tibia/Fibula Left  Result Date: 04/08/2022 CLINICAL DATA:  Atraumatic left lower leg pain EXAM: LEFT TIBIA AND FIBULA - 2 VIEW COMPARISON:  Left ankle x-ray 06/15/2021 FINDINGS: There is no evidence of fracture or other focal bone lesions involving the left tibia or fibula. No malalignment. Prominent enthesophyte at the Achilles tendon insertion. Unchanged dorsal spurring at the talonavicular joint. Bidirectional patellar enthesophytes. Soft tissues are unremarkable. IMPRESSION: No acute osseous abnormality of the left tibia or fibula. Electronically Signed   By: Duanne Guess D.O.   On: 04/08/2022 12:33    Pertinent labs & imaging results that were available during my care of the patient were reviewed by me and considered in my medical decision making (see MDM for details).  Medications Ordered in ED Medications - No data to display                                                                                                                                   Procedures Procedures  (including critical care time)  Medical Decision Making / ED Course   This patient presents to the ED for concern of leg pain, this involves an extensive number of treatment options, and is a complaint that carries with it a high risk of complications and morbidity.  The differential diagnosis includes DVT, musculoskeletal pain, fracture, bone lesion, ligamentous injury  MDM: Patient seen emergency room for evaluation of leg pain and  swelling.  Physical exam with mild tenderness to the medial left knee, mild lower extremity edema but no tenderness along the calf.  DVT ultrasound reassuringly negative.  X-ray negative.  On reevaluation, patient remains pain-free here in the emergency department and was discharged with Naprosyn for pain control and outpatient follow-up.   Additional history obtained:  -External records from outside  source obtained and reviewed including: Chart review including previous notes, labs, imaging, consultation notes   Imaging Studies ordered: I ordered imaging studies including DVT ultrasound, x-ray tib-fib I independently visualized and interpreted imaging. I agree with the radiologist interpretation   Medicines ordered and prescription drug management: Meds ordered this encounter  Medications   naproxen (NAPROSYN) 375 MG tablet    Sig: Take 1 tablet (375 mg total) by mouth 2 (two) times daily.    Dispense:  20 tablet    Refill:  0    -I have reviewed the patients home medicines and have made adjustments as needed  Critical interventions none    Cardiac Monitoring: The patient was maintained on a cardiac monitor.  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   Reevaluation: After the interventions noted above, I reevaluated the patient and found that they have :improved  Co morbidities that complicate the patient evaluation  Past Medical History:  Diagnosis Date   Carpal tunnel syndrome of left wrist    Chest pain    Edema    Gastric ulcer    Gout    HTN (hypertension)    Paronychia       Dispostion: I considered admission for this patient, but at this time he does not meet inpatient criteria for admission he is safe for discharge with outpatient follow-up     Final  Clinical Impression(s) / ED Diagnoses Final diagnoses:  Left leg pain     @PCDICTATION @    Glendora ScoreKommor, Neima Lacross, MD 04/08/22 1936

## 2022-04-08 NOTE — ED Triage Notes (Signed)
3- day history of left leg pain radiating down to his knee. Denies any falls or trauma.

## 2022-04-08 NOTE — ED Provider Notes (Signed)
[facility]    CSN: [id number] Arrival date & time: 04/08/22  1008      History   Chief Complaint Chief Complaint  Patient presents with   Leg Pain    HPI Matthew Foster is a 67 y.o. male.   Patient presents with 3-day history of left lower leg pain and swelling.  He denies any obvious injury to the area.  Reports pain is present at the medial portion of the knee and radiates down leg.  Denies numbness or tingling.  Denies history of chronic leg pain.  He does report that he has a history of gout in his knee but this feels different.  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  Denies any fever. Denies that he smokes cigarettes.   Leg Pain   Past Medical History:  Diagnosis Date   Carpal tunnel syndrome of left wrist    Chest pain    Edema    Gastric ulcer    Gout    HTN (hypertension)    Paronychia     Patient Active Problem List   Diagnosis Date Noted   Chest pain 10/06/2010   HTN (hypertension) 10/06/2010   Abnormal ECG 10/06/2010    Past Surgical History:  Procedure Laterality Date   ABDOMINAL SURGERY     gastric ulcer repair         Home Medications    Prior to Admission medications   Medication Sig Start Date End Date Taking? Authorizing Provider  allopurinol (ZYLOPRIM) 100 MG tablet Take 2 tablets (200 mg total) by mouth daily. 11/25/20 02/23/21  Theadora Rama Scales, PA-C  amLODipine (NORVASC) 10 MG tablet Take 10 mg by mouth daily.      [provider]  aspirin 81 MG tablet Take 81 mg by mouth daily.      [provider]  ipratropium (ATROVENT) 0.03 % nasal spray Place 2 sprays into both nostrils 2 (two) times daily. 10/11/20   Wallis Bamberg, PA-C  lisinopril (PRINIVIL,ZESTRIL) 20 MG tablet Take 20 mg by mouth daily.      [provider]  loperamide (IMODIUM) 2 MG capsule Take 1 capsule (2 mg total) by mouth 2 (two) times daily as needed for diarrhea or loose stools.  01/07/22   Wallis Bamberg, PA-C  meloxicam (MOBIC) 7.5 MG tablet Take 1 tablet (7.5 mg total) by mouth daily. 06/15/21   Carlisle Beers, FNP  metoprolol succinate (TOPROL-XL) 100 MG 24 hr tablet Take 100 mg by mouth daily. Take with or immediately following a meal.    [provider]  ondansetron (ZOFRAN-ODT) 8 MG disintegrating tablet Take 1 tablet (8 mg total) by mouth every 8 (eight) hours as needed for nausea or vomiting. 01/07/22   Wallis Bamberg, PA-C    Family History Family History  Problem Relation Age of Onset   Diabetes Mother    Diabetes Father     Social History Social History   Tobacco Use   Smoking status: Never   Smokeless tobacco: Never  Vaping Use   Vaping Use: Never used  Substance Use Topics   Alcohol use: No   Drug use: No     Allergies   Patient has no known allergies.   Review of Systems Review of Systems Per HPI  Physical Exam Triage Vital Signs ED Triage Vitals  Enc Vitals Group     BP 04/08/22 1056 (!) 162/94     Pulse  Rate 04/08/22 1056 62     Resp 04/08/22 1056 18     Temp 04/08/22 1056 98.4 F (36.9 C)     Temp Source 04/08/22 1056 Oral     SpO2 04/08/22 1056 96 %     Weight --      Height --      Head Circumference --      Peak Flow --      Pain Score 04/08/22 1113 0     Pain Loc --      Pain Edu? --      Excl. in GC? --    No data found.  Updated Vital Signs BP (!) 162/94 (BP Location: Left Arm)   Pulse 62   Temp 98.4 F (36.9 C) (Oral)   Resp 18   SpO2 96%   Visual Acuity Right Eye Distance:   Left Eye Distance:   Bilateral Distance:    Right Eye Near:   Left Eye Near:    Bilateral Near:     Physical Exam Constitutional:      General: He is not in acute distress.    Appearance: Normal appearance. He is not toxic-appearing or diaphoretic.  HENT:     Head: Normocephalic and atraumatic.  Eyes:     Extraocular Movements: Extraocular movements intact.     Conjunctiva/sclera: Conjunctivae normal.   Pulmonary:     Effort: Pulmonary effort is normal.  Musculoskeletal:     Comments: Patient has pitting edema that is 1+ present to anterior leg directly below knee.  Very mild erythema noted in this area.  No lacerations or abrasions.  Patient also has tenderness to palpation over this area as well as in the medial portion of the left knee.  No tenderness to ankle or foot.  Patient can wiggle toes and has full range of motion of ankle and knee.  Capillary refill and pulses are intact.  Neurovascularly he is intact.  Neurological:     General: No focal deficit present.     Mental Status: He is alert and oriented to person, place, and time. Mental status is at baseline.  Psychiatric:        Mood and Affect: Mood normal.        Behavior: Behavior normal.        Thought Content: Thought content normal.        Judgment: Judgment normal.      UC Treatments / Results  Labs (all labs ordered are listed, but only abnormal results are displayed) Labs Reviewed - No data to display  EKG   Radiology No results found.  Procedures Procedures (including critical care time)  Medications Ordered in UC Medications - No data to display  Initial Impression / Assessment and Plan / UC Course  I have reviewed the triage vital signs and the nursing notes.  Pertinent labs & imaging results that were available during my care of the patient were reviewed by me and considered in my medical decision making (see chart for details).     Differential diagnoses include DVT versus cellulitis of leg. 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  Do not have ultrasound capabilities here in urgent care given it is the weekend so patient was encouraged to go to the emergency department today to rule out DVT.  Patient was agreeable with this plan.  Vital signs and patient stable at discharge.  Agree with patient self transport to the ER. Final Clinical Impressions(s) / UC Diagnoses  Final diagnoses:   Pain and swelling of left lower leg     Discharge Instructions      Please go to the emergency department for further evaluation and management as I am concerned that you may have a DVT.    ED Prescriptions   None    PDMP not reviewed this encounter.   Gustavus Bryant, [place] 04/08/22 1128

## 2022-04-08 NOTE — ED Notes (Signed)
AVS reviewed with pt prior to discharge. Pt verbalizes understanding of teaching. Belongings with pt prior to depart. Ambulatory to POV by self.

## 2022-04-08 NOTE — Progress Notes (Signed)
VASCULAR LAB    Left lower extremity venous duplex has been performed.  See CV proc for preliminary results.  Messaged negative results to Dr. Donneta Romberg, [facility], RVT 04/08/2022, 12:48 PM

## 2022-04-11 ENCOUNTER — Telehealth: Payer: Self-pay | Admitting: *Deleted

## 2022-04-11 NOTE — Telephone Encounter (Signed)
        Patient  visited [facility] ed on 04/08/2022  for treatment   Voicemail not set up   Alois Cliche -[facility] Page, Population Health [phone] [address] [place] , [place] [address] Email : [email or web address]

## 2022-05-12 ENCOUNTER — Encounter (HOSPITAL_COMMUNITY): Payer: Self-pay

## 2022-05-12 ENCOUNTER — Ambulatory Visit (HOSPITAL_COMMUNITY)
Admission: EM | Admit: 2022-05-12 | Discharge: 2022-05-12 | Disposition: A | Discharge status: Home / Self Care | Payer: PPO | Attending: Emergency Medicine | Admitting: Emergency Medicine

## 2022-05-12 DIAGNOSIS — M25561 Pain in right knee: Secondary | ICD-10-CM

## 2022-05-12 DIAGNOSIS — M25562 Pain in left knee: Secondary | ICD-10-CM

## 2022-05-12 MED ORDER — METHYLPREDNISOLONE SODIUM SUCC 125 MG IJ SOLR
INTRAMUSCULAR | Status: AC
  Filled 2022-05-12: qty 2

## 2022-05-12 MED ORDER — METHYLPREDNISOLONE SODIUM SUCC 125 MG IJ SOLR
60.0000 mg | Freq: Once | INTRAMUSCULAR | Status: AC
  Administered 2022-05-12: 60 mg via INTRAMUSCULAR

## 2022-05-12 MED ORDER — PREDNISONE 10 MG (21) PO TBPK: ORAL_TABLET | Freq: Every day | ORAL | 0 refills | Status: DC

## 2022-05-12 MED ORDER — BACLOFEN 5 MG PO TABS: 5.0000 mg | ORAL_TABLET | Freq: Every evening | ORAL | 0 refills | Status: DC | PRN

## 2022-05-12 NOTE — Discharge Instructions (Signed)
Today you are being treated for a flare of your arthritis  You have been given an injection of methylprednisolone which is a steroid here today in our office, this medicine helps to reduce inflammation which in turn will help your pain ideally you will start to see improvement in the next 30 minutes to an hour  Starting tomorrow take prednisone every morning as directed to continue the process above, you may take Tylenol 500 to 1000 mg every 6 hours in addition to this as needed  You may use baclofen at bedtime as needed for additional comfort, be mindful this may make you feel drowsy  You may continue use of cane for stability and support as well as compression to the knee  You may use ice or heat over the affected area in 10 to 15-minute intervals  You may elevate legs when sitting and lying in place pillows underneath for additional support and comfort  If your symptoms continue to persist or worsen you may follow-up with his urgent care as needed for reevaluation

## 2022-05-12 NOTE — ED Triage Notes (Signed)
Patient here today with Bilat knee pain and swelling since Wednesday. Right knee is worse that the left. He has a h/o gout and arthritis. Pain increased with weightbearing. He has been taking Advil which helps a little. He has been icing them. He has a knee brace. He is a Naval architect and sits a lot.

## 2022-05-12 NOTE — ED Provider Notes (Signed)
[facility]    CSN: [id number] Arrival date & time: 05/12/22  1048      History   Chief Complaint Chief Complaint  Patient presents with   Knee Pain    HPI Matthew Foster is a 67 y.o. male.   Patient presents for evaluation of bilateral knee pain and swelling beginning 4 days ago.  Pain is worse with bending and when bearing weight.  Has been using cane, independent at baseline.  Right side worse than left and swelling to the right side radiates down the leg.  History of gout and arthritis.  Has been taking 800 mg of Advil which has been minimally effective.  She only has attempted use of compression hosiery.  denies numbness, tingling, injury or trauma.  Works as a Naval architect.   Past Medical History:  Diagnosis Date   Carpal tunnel syndrome of left wrist    Chest pain    Edema    Gastric ulcer    Gout    HTN (hypertension)    Paronychia     Patient Active Problem List   Diagnosis Date Noted   Chest pain 10/06/2010   HTN (hypertension) 10/06/2010   Abnormal ECG 10/06/2010    Past Surgical History:  Procedure Laterality Date   ABDOMINAL SURGERY     gastric ulcer repair         Home Medications    Prior to Admission medications   Medication Sig Start Date End Date Taking? Authorizing Provider  amLODipine (NORVASC) 10 MG tablet Take 10 mg by mouth daily.     Yes [provider]  aspirin 81 MG tablet Take 81 mg by mouth daily.     Yes [provider]  lisinopril (PRINIVIL,ZESTRIL) 20 MG tablet Take 20 mg by mouth daily.     Yes [provider]  metoprolol succinate (TOPROL-XL) 100 MG 24 hr tablet Take 100 mg by mouth daily. Take with or immediately following a meal.   Yes [provider]  allopurinol (ZYLOPRIM) 100 MG tablet Take 2 tablets (200 mg total) by mouth daily. 11/25/20 02/23/21  Theadora Rama Scales, PA-C  ipratropium (ATROVENT) 0.03 % nasal spray Place 2 sprays into both nostrils 2 (two) times daily.  10/11/20   Wallis Bamberg, PA-C  loperamide (IMODIUM) 2 MG capsule Take 1 capsule (2 mg total) by mouth 2 (two) times daily as needed for diarrhea or loose stools. 01/07/22   Wallis Bamberg, PA-C  naproxen (NAPROSYN) 375 MG tablet Take 1 tablet (375 mg total) by mouth 2 (two) times daily. 04/08/22   Kommor, Madison, MD  ondansetron (ZOFRAN-ODT) 8 MG disintegrating tablet Take 1 tablet (8 mg total) by mouth every 8 (eight) hours as needed for nausea or vomiting. 01/07/22   Wallis Bamberg, PA-C    Family History Family History  Problem Relation Age of Onset   Diabetes Mother    Diabetes Father     Social History Social History   Tobacco Use   Smoking status: Never   Smokeless tobacco: Never  Vaping Use   Vaping Use: Never used  Substance Use Topics   Alcohol use: No   Drug use: No     Allergies   Patient has no known allergies.   Review of Systems Review of Systems   Physical Exam Triage Vital Signs ED Triage Vitals  Enc Vitals Group     BP 05/12/22 1137 (!) 164/99     Pulse Rate 05/12/22 1137 63     Resp  05/12/22 1137 16     Temp 05/12/22 1137 98.4 F (36.9 C)     Temp Source 05/12/22 1137 Oral     SpO2 05/12/22 1137 95 %     Weight 05/12/22 1137 280 lb (127 kg)     Height 05/12/22 1137 6\' 3"  (1.905 m)     Head Circumference --      Peak Flow --      Pain Score 05/12/22 1136 10     Pain Loc --      Pain Edu? --      Excl. in GC? --    No data found.  Updated Vital Signs BP (!) 164/99 (BP Location: Left Arm)   Pulse 63   Temp 98.4 F (36.9 C) (Oral)   Resp 16   Ht 6\' 3"  (1.905 m)   Wt 280 lb (127 kg)   SpO2 95%   BMI 35.00 kg/m   Visual Acuity Right Eye Distance:   Left Eye Distance:   Bilateral Distance:    Right Eye Near:   Left Eye Near:    Bilateral Near:     Physical Exam Constitutional:      Appearance: Normal appearance.  Eyes:     Extraocular Movements: Extraocular movements intact.  Pulmonary:     Effort: Pulmonary effort is normal.   Musculoskeletal:     Comments: Mild to moderate swelling present to the right knee, no swelling present to the left, unable to reproduce tenderness, no ecchymosis or deformity, able to bear weight to the lower extremities, able to complete range of motion, 2+ popliteal pulses bilaterally  Neurological:     Mental Status: He is alert and oriented to person, place, and time. Mental status is at baseline.      UC Treatments / Results  Labs (all labs ordered are listed, but only abnormal results are displayed) Labs Reviewed - No data to display  EKG   Radiology No results found.  Procedures Procedures (including critical care time)  Medications Ordered in UC Medications - No data to display  Initial Impression / Assessment and Plan / UC Course  I have reviewed the triage vital signs and the nursing notes.  Pertinent labs & imaging results that were available during my care of the patient were reviewed by me and considered in my medical decision making (see chart for details).  Acute pain of both knees   Most likely arthritic pain, low suspicion for bone involvement due to lack of injury therefore imaging deferred, methylprednisolone injection given in office and prednisone taper as well as baclofen prescribed for outpatient use, recommended continued use of supportive measures with follow-up with urgent care if symptoms persist or worsen Final Clinical Impressions(s) / UC Diagnoses   Final diagnoses:  None   Discharge Instructions   None    ED Prescriptions   None    PDMP not reviewed this encounter.   Valinda Hoar, NP 05/12/22 1200

## 2022-05-16 ENCOUNTER — Encounter: Payer: Self-pay | Admitting: Family

## 2022-05-16 ENCOUNTER — Ambulatory Visit (INDEPENDENT_AMBULATORY_CARE_PROVIDER_SITE_OTHER): Payer: PPO | Admitting: Family

## 2022-05-16 VITALS — BP 140/100 | HR 58 | Temp 97.3°F | Resp 18 | Ht 75.0 in | Wt 286.0 lb

## 2022-05-16 DIAGNOSIS — I10 Essential (primary) hypertension: Secondary | ICD-10-CM | POA: Diagnosis not present

## 2022-05-16 DIAGNOSIS — E785 Hyperlipidemia, unspecified: Secondary | ICD-10-CM | POA: Diagnosis not present

## 2022-05-16 DIAGNOSIS — Z125 Encounter for screening for malignant neoplasm of prostate: Secondary | ICD-10-CM

## 2022-05-16 DIAGNOSIS — M10061 Idiopathic gout, right knee: Secondary | ICD-10-CM

## 2022-05-16 MED ORDER — ALLOPURINOL 100 MG PO TABS: 200.0000 mg | ORAL_TABLET | Freq: Every day | ORAL | 2 refills | Status: DC

## 2022-05-16 NOTE — Progress Notes (Signed)
Provider: Richarda Blade FNP-C   Dante Roudebush, Donalee Citrin, NP  Patient Care Team: Torence Palmeri, Donalee Citrin, NP as PCP - General (Family Medicine)  Extended Emergency Contact Information Primary Emergency Contact: Stepanek,Brittany Mobile Phone: [phone] Relation: Daughter Secondary Emergency Contact: Stinnette,Jarred  [place] Mobile Phone: [phone] Relation: Son  Code Status:  Full Code  Goals of care: Advanced Directive information    05/16/2022    9:06 AM  Advanced Directives  Does Patient Have a Medical Advance Directive? No  Would patient like information on creating a medical advance directive? No - Patient declined     Chief Complaint  Patient presents with  . Establish Care    New patient here to establish care. Patient has medication bottles at initial appointment. Discuss need for updated vaccines, colonoscopy, Hep C screening and AWV    HPI:  Pt is a 67 y.o. male seen today establish care here at [facility] Adult  care for medical management of chronic diseases.Has a medical history of Hypertension   Has not been taking Blood pressure since he was put on Prednisone for gout.states does not like to mix prednisone and blood pressure medication.  States had gout on the right knee.   Tries to avoid foods that worsen gout.  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  Due for DTap,Shingles,Pneumonia and COVID-19 vaccine.states had shingles vaccine last year.  Also state had Pneumonia vaccine at the [facility].  Has had Colonoscopy 3/31/20217 will bring copy of colonoscopy done at [facility].     Past Medical History:  Diagnosis Date  . Carpal tunnel syndrome of left wrist   . Chest pain   . Edema   . Gastric ulcer   . Gout   . HTN (hypertension)   . Paronychia    Past Surgical History:  Procedure Laterality Date  . ABDOMINAL SURGERY    . gastric ulcer repair      No Known Allergies  Allergies as of 05/16/2022   No Known  Allergies      Medication List        Accurate as of May 16, 2022  9:07 AM. If you have any questions, ask your nurse or doctor.          STOP taking these medications    loperamide 2 MG capsule Commonly known as: IMODIUM Stopped by: Caesar Bookman, NP   naproxen 375 MG tablet Commonly known as: NAPROSYN Stopped by: Donalee Citrin Hailei Besser, NP   ondansetron 8 MG disintegrating tablet Commonly known as: ZOFRAN-ODT Stopped by: Caesar Bookman, NP       TAKE these medications    allopurinol 100 MG tablet Commonly known as: ZYLOPRIM Take 2 tablets (200 mg total) by mouth daily.   amLODipine 10 MG tablet Commonly known as: NORVASC Take 10 mg by mouth daily.   aspirin 81 MG tablet Take 81 mg by mouth daily.   Baclofen 5 MG Tabs Take 1 tablet (5 mg total) by mouth at bedtime as needed.   ipratropium 0.03 % nasal spray Commonly known as: ATROVENT Place 2 sprays into both nostrils 2 (two) times daily.   lisinopril 20 MG tablet Commonly known as: ZESTRIL Take 20 mg by mouth daily.   metoprolol succinate 100 MG 24 hr tablet Commonly known as: TOPROL-XL Take 100 mg by mouth daily. Take with or immediately following a meal.   predniSONE 10 MG (21) Tbpk tablet Commonly known as: STERAPRED UNI-PAK 21 TAB  Take by mouth daily. Take 6 tabs by mouth daily  for 1 days, then 5 tabs for 1 days, then 4 tabs for 1 days, then 3 tabs for 1 days, 2 tabs for 1 days, then 1 tab by mouth daily for 1 days        Review of Systems  Constitutional:  Negative for appetite change, chills, fatigue, fever and unexpected weight change.  HENT:  Negative for congestion, dental problem, ear discharge, ear pain, facial swelling, hearing loss, nosebleeds, postnasal drip, rhinorrhea, sinus pressure, sinus pain, sneezing, sore throat, tinnitus and trouble swallowing.   Eyes:  Negative for pain, discharge, redness, itching and visual disturbance.  Respiratory:  Negative for cough, chest tightness,  shortness of breath and wheezing.   Cardiovascular:  Negative for chest pain, palpitations and leg swelling.  Gastrointestinal:  Negative for abdominal distention, abdominal pain, blood in stool, constipation, diarrhea, nausea and vomiting.  Endocrine: Negative for cold intolerance, heat intolerance, polydipsia, polyphagia and polyuria.  Genitourinary:  Negative for difficulty urinating, dysuria, flank pain, frequency and urgency.  Musculoskeletal:  Negative for arthralgias, back pain, gait problem, joint swelling, myalgias, neck pain and neck stiffness.  Skin:  Negative for color change, pallor, rash and wound.  Neurological:  Negative for dizziness, syncope, speech difficulty, weakness, light-headedness, numbness and headaches.  Hematological:  Does not bruise/bleed easily.  Psychiatric/Behavioral:  Negative for agitation, behavioral problems, confusion, hallucinations, self-injury, sleep disturbance and suicidal ideas. The patient is not nervous/anxious.     Immunization History  Administered Date(s) Administered  . PFIZER(Purple Top)SARS-COV-2 Vaccination 04/18/2019, 10/25/2019, 05/11/2020   Pertinent  Health Maintenance Due  Topic Date Due  . COLONOSCOPY (Pts 45-18yrs Insurance coverage will need to be confirmed)  Never done  . INFLUENZA VACCINE  08/02/2022      11/19/2020   10:53 AM 11/25/2020    7:38 PM 06/15/2021    1:21 PM 01/07/2022    2:11 PM 05/16/2022    8:29 AM  Fall Risk  Falls in the past year?     0  Was there an injury with Fall?     0  Fall Risk Category Calculator     0  (RETIRED) Patient Fall Risk Level Low fall risk Low fall risk Low fall risk Low fall risk   Patient at Risk for Falls Due to     No Fall Risks  Fall risk Follow up     Falls evaluation completed   Functional Status Survey:    Vitals:   05/16/22 0902 05/16/22 0905  BP: (!) 150/100 (!) 140/100  Pulse: (!) 58   Resp: 18   Temp: (!) 97.3 F (36.3 C)   SpO2: 98%   Weight: 286 lb (129.7 kg)    Height: 6\' 3"  (1.905 m)    Body mass index is 35.75 kg/m. Physical Exam Vitals reviewed.  Constitutional:      General: He is not in acute distress.    Appearance: Normal appearance. He is obese. He is not ill-appearing or diaphoretic.  HENT:     Head: Normocephalic.     Right Ear: Tympanic membrane, ear canal and external ear normal. There is no impacted cerumen.     Left Ear: Tympanic membrane, ear canal and external ear normal. There is no impacted cerumen.     Nose: Nose normal. No congestion or rhinorrhea.     Mouth/Throat:     Mouth: Mucous membranes are moist.     Pharynx: Oropharynx is clear. No oropharyngeal exudate or  posterior oropharyngeal erythema.  Eyes:     General: No scleral icterus.       Right eye: No discharge.        Left eye: No discharge.     Extraocular Movements: Extraocular movements intact.     Conjunctiva/sclera: Conjunctivae normal.     Pupils: Pupils are equal, round, and reactive to light.  Neck:     Vascular: No carotid bruit.  Cardiovascular:     Rate and Rhythm: Normal rate and regular rhythm.     Pulses: Normal pulses.     Heart sounds: Normal heart sounds. No murmur heard.    No friction rub. No gallop.  Pulmonary:     Effort: Pulmonary effort is normal. No respiratory distress.     Breath sounds: Normal breath sounds. No wheezing, rhonchi or rales.  Chest:     Chest wall: No tenderness.  Abdominal:     General: Bowel sounds are normal. There is no distension.     Palpations: Abdomen is soft. There is no mass.     Tenderness: There is no abdominal tenderness. There is no right CVA tenderness, left CVA tenderness, guarding or rebound.  Musculoskeletal:        General: No swelling or tenderness. Normal range of motion.     Cervical back: Normal range of motion. No rigidity or tenderness.     Right lower leg: No edema.     Left lower leg: No edema.  Lymphadenopathy:     Cervical: No cervical adenopathy.  Skin:    General: Skin is warm  and dry.     Coloration: Skin is not pale.     Findings: No bruising, erythema, lesion or rash.  Neurological:     Mental Status: He is alert and oriented to person, place, and time.     Cranial Nerves: No cranial nerve deficit.     Sensory: No sensory deficit.     Motor: No weakness.     Coordination: Coordination normal.     Gait: Gait normal.  Psychiatric:        Mood and Affect: Mood normal.        Speech: Speech normal.        Behavior: Behavior normal.        Thought Content: Thought content normal.        Judgment: Judgment normal.    Labs reviewed: No results for input(s): "NA", "K", "CL", "CO2", "GLUCOSE", "BUN", "CREATININE", "CALCIUM", "MG", "PHOS" in the last 8760 hours. No results for input(s): "AST", "ALT", "ALKPHOS", "BILITOT", "PROT", "ALBUMIN" in the last 8760 hours. No results for input(s): "WBC", "NEUTROABS", "HGB", "HCT", "MCV", "PLT" in the last 8760 hours. No results found for: "TSH" No results found for: "HGBA1C" Lab Results  Component Value Date   CHOL  07/19/2006    135        ATP III CLASSIFICATION:  <200     mg/dL   Desirable  161-096  mg/dL   Borderline High  >=045    mg/dL   High   HDL 35 (L) 40/98/1191   LDLCALC  07/19/2006    86        Total Cholesterol/HDL:CHD Risk Coronary Heart Disease Risk Table                     Men   Women  1/2 Average Risk   3.4   3.3   TRIG 70 07/19/2006   CHOLHDL 3.9 07/19/2006    Significant  Diagnostic Results in last 30 days:  No results found.  Assessment/Plan 1. Essential hypertension ***    Family/ staff Communication: Reviewed plan of care with patient  Labs/tests ordered: None   Next Appointment :   Caesar Bookman, NP

## 2022-05-17 DIAGNOSIS — E785 Hyperlipidemia, unspecified: Secondary | ICD-10-CM | POA: Insufficient documentation

## 2022-05-17 LAB — CBC WITH DIFFERENTIAL/PLATELET
Absolute Monocytes: 663 cells/uL (ref 200–950)
Basophils Absolute: 50 cells/uL (ref 0–200)
Basophils Relative: 0.4 %
Eosinophils Absolute: 63 cells/uL (ref 15–500)
Eosinophils Relative: 0.5 %
HCT: 42.6 % (ref 38.5–50.0)
Hemoglobin: 13.9 g/dL (ref 13.2–17.1)
Lymphs Abs: 2613 cells/uL (ref 850–3900)
MCH: 26.5 pg — ABNORMAL LOW (ref 27.0–33.0)
MCHC: 32.6 g/dL (ref 32.0–36.0)
MCV: 81.3 fL (ref 80.0–100.0)
MPV: 9.3 fL (ref 7.5–12.5)
Monocytes Relative: 5.3 %
Neutro Abs: 9113 cells/uL — ABNORMAL HIGH (ref 1500–7800)
Neutrophils Relative %: 72.9 %
Platelets: 429 10*3/uL — ABNORMAL HIGH (ref 140–400)
RBC: 5.24 10*6/uL (ref 4.20–5.80)
RDW: 13.6 % (ref 11.0–15.0)
Total Lymphocyte: 20.9 %
WBC: 12.5 10*3/uL — ABNORMAL HIGH (ref 3.8–10.8)

## 2022-05-17 LAB — COMPLETE METABOLIC PANEL WITH GFR
AG Ratio: 1.5 (calc) (ref 1.0–2.5)
ALT: 23 U/L (ref 9–46)
AST: 15 U/L (ref 10–35)
Albumin: 4 g/dL (ref 3.6–5.1)
Alkaline phosphatase (APISO): 91 U/L (ref 35–144)
BUN: 22 mg/dL (ref 7–25)
CO2: 29 mmol/L (ref 20–32)
Calcium: 9.4 mg/dL (ref 8.6–10.3)
Chloride: 103 mmol/L (ref 98–110)
Creat: 1.03 mg/dL (ref 0.70–1.35)
Globulin: 2.7 g/dL (calc) (ref 1.9–3.7)
Glucose, Bld: 86 mg/dL (ref 65–99)
Potassium: 4.4 mmol/L (ref 3.5–5.3)
Sodium: 139 mmol/L (ref 135–146)
Total Bilirubin: 0.3 mg/dL (ref 0.2–1.2)
Total Protein: 6.7 g/dL (ref 6.1–8.1)
eGFR: 80 mL/min/{1.73_m2} (ref >=60)

## 2022-05-17 LAB — PSA, TOTAL AND FREE
PSA, % Free: 22 % (calc) — ABNORMAL LOW (ref >25)
PSA, Free: 0.4 ng/mL
PSA, Total: 1.8 ng/mL (ref <=4.0)

## 2022-05-17 LAB — LIPID PANEL
Cholesterol: 148 mg/dL (ref <200)
HDL: 55 mg/dL (ref >=40)
LDL Cholesterol (Calc): 77 mg/dL (calc)
Non-HDL Cholesterol (Calc): 93 mg/dL (calc) (ref <130)
Total CHOL/HDL Ratio: 2.7 (calc) (ref <5.0)
Triglycerides: 75 mg/dL (ref <150)

## 2022-05-17 LAB — TSH: TSH: 2.22 mIU/L (ref 0.40–4.50)

## 2022-06-04 ENCOUNTER — Ambulatory Visit (INDEPENDENT_AMBULATORY_CARE_PROVIDER_SITE_OTHER): Payer: PPO | Admitting: Family

## 2022-06-04 ENCOUNTER — Encounter: Payer: Self-pay | Admitting: Family

## 2022-06-04 VITALS — BP 126/78 | HR 64 | Temp 97.4°F | Resp 18 | Ht 75.0 in | Wt 282.0 lb

## 2022-06-04 DIAGNOSIS — I10 Essential (primary) hypertension: Secondary | ICD-10-CM | POA: Diagnosis not present

## 2022-06-04 NOTE — Progress Notes (Signed)
Provider: Richarda Blade FNP-C  Tikesha Mort, Donalee Citrin, NP  Patient Care Team: Aarini Slee, Donalee Citrin, NP as PCP - General (Family Medicine)  Extended Emergency Contact Information Primary Emergency Contact: Decola,Brittany Mobile Phone: [phone] Relation: Daughter Secondary Emergency Contact: Gwinner,Jarred  [place] Mobile Phone: [phone] Relation: Son  Code Status:  Full Code  Goals of care: Advanced Directive information    05/16/2022    9:06 AM  Advanced Directives  Does Patient Have a Medical Advance Directive? No  Would patient like information on creating a medical advance directive? No - Patient declined     Chief Complaint  Patient presents with   Acute Visit    Patient is here for a two week F/U for BP   Quality Metric Gaps    Patient is due for several updated vaccines and an AWV    HPI:  Pt is a 67 y.o. male seen today for an acute visit for  2 weeks follow up on high blood pressure.States has been taking medication as prescribed. Brought in B/p log readings in the 120'/80's - 130's/80's HR 60's -70's. He denies any headache,dizziness,vision changes,fatigue,chest tightness,palpitation,chest pain or shortness of breath.    Gout has resolved.  Has modified his diet since last seen two weeks ago.weight down from 286 lbs to 282 lbs.   Past Medical History:  Diagnosis Date   Carpal tunnel syndrome of left wrist    Chest pain    Edema    Gastric ulcer    Gout    HTN (hypertension)    Paronychia    Past Surgical History:  Procedure Laterality Date   ABDOMINAL SURGERY     gastric ulcer repair      No Known Allergies  Outpatient Encounter Medications as of 06/04/2022  Medication Sig   allopurinol (ZYLOPRIM) 100 MG tablet Take 2 tablets (200 mg total) by mouth daily.   amLODipine (NORVASC) 10 MG tablet Take 10 mg by mouth daily.     aspirin 81 MG tablet Take 81 mg by mouth daily.     Baclofen 5 MG TABS Take 1 tablet (5 mg total) by mouth  at bedtime as needed.   lisinopril (PRINIVIL,ZESTRIL) 20 MG tablet Take 40 mg by mouth daily.   metoprolol succinate (TOPROL-XL) 100 MG 24 hr tablet Take 100 mg by mouth daily. Take with or immediately following a meal.   rosuvastatin (CRESTOR) 10 MG tablet Take 10 mg by mouth daily.   [DISCONTINUED] ipratropium (ATROVENT) 0.03 % nasal spray Place 2 sprays into both nostrils 2 (two) times daily. (Patient not taking: Reported on 05/16/2022)   [DISCONTINUED] predniSONE (STERAPRED UNI-PAK 21 TAB) 10 MG (21) TBPK tablet Take by mouth daily. Take 6 tabs by mouth daily  for 1 days, then 5 tabs for 1 days, then 4 tabs for 1 days, then 3 tabs for 1 days, 2 tabs for 1 days, then 1 tab by mouth daily for 1 days   No facility-administered encounter medications on file as of 06/04/2022.    Review of Systems  Constitutional:  Negative for appetite change, chills, fatigue, fever and unexpected weight change.  Eyes:  Negative for pain, discharge, redness, itching and visual disturbance.  Respiratory:  Negative for cough, chest tightness, shortness of breath and wheezing.   Cardiovascular:  Negative for chest pain, palpitations and leg swelling.  Gastrointestinal:  Negative for abdominal distention, abdominal pain, blood in stool, constipation, diarrhea, nausea and vomiting.  Musculoskeletal:  Positive for arthralgias. Negative for back  pain, gait problem, joint swelling, myalgias, neck pain and neck stiffness.  Skin:  Negative for color change, pallor and rash.  Neurological:  Negative for dizziness, syncope, speech difficulty, weakness, light-headedness, numbness and headaches.    Immunization History  Administered Date(s) Administered   PFIZER(Purple Top)SARS-COV-2 Vaccination 04/18/2019, 10/25/2019, 05/11/2020   Zoster Recombinat (Shingrix) 04/01/2021   Pertinent  Health Maintenance Due  Topic Date Due   INFLUENZA VACCINE  08/02/2022   Colonoscopy  07/11/2031      11/19/2020   10:53 AM 11/25/2020     7:38 PM 06/15/2021    1:21 PM 01/07/2022    2:11 PM 05/16/2022    8:29 AM  Fall Risk  Falls in the past year?     0  Was there an injury with Fall?     0  Fall Risk Category Calculator     0  (RETIRED) Patient Fall Risk Level Low fall risk Low fall risk Low fall risk Low fall risk   Patient at Risk for Falls Due to     No Fall Risks  Fall risk Follow up     Falls evaluation completed   Functional Status Survey:    Vitals:   06/04/22 0939  BP: 126/78  Pulse: 64  Resp: 18  Temp: (!) 97.4 F (36.3 C)  SpO2: 97%  Weight: 282 lb (127.9 kg)  Height: 6\' 3"  (1.905 m)   Body mass index is 35.25 kg/m. Physical Exam Vitals reviewed.  Constitutional:      General: He is not in acute distress.    Appearance: Normal appearance. He is obese. He is not ill-appearing or diaphoretic.  HENT:     Head: Normocephalic.     Nose: Nose normal. No congestion or rhinorrhea.     Mouth/Throat:     Mouth: Mucous membranes are moist.     Pharynx: Oropharynx is clear. No oropharyngeal exudate or posterior oropharyngeal erythema.  Eyes:     General: No scleral icterus.       Right eye: No discharge.        Left eye: No discharge.     Conjunctiva/sclera: Conjunctivae normal.     Pupils: Pupils are equal, round, and reactive to light.  Neck:     Vascular: No carotid bruit.  Cardiovascular:     Rate and Rhythm: Normal rate and regular rhythm.     Pulses: Normal pulses.     Heart sounds: Normal heart sounds. No murmur heard.    No friction rub. No gallop.  Pulmonary:     Effort: Pulmonary effort is normal. No respiratory distress.     Breath sounds: Normal breath sounds. No wheezing, rhonchi or rales.  Chest:     Chest wall: No tenderness.  Abdominal:     General: Bowel sounds are normal. There is no distension.     Palpations: Abdomen is soft. There is no mass.     Tenderness: There is no abdominal tenderness. There is no right CVA tenderness, left CVA tenderness, guarding or rebound.   Musculoskeletal:        General: No swelling or tenderness. Normal range of motion.     Cervical back: Normal range of motion. No rigidity or tenderness.     Right lower leg: No edema.     Left lower leg: No edema.  Lymphadenopathy:     Cervical: No cervical adenopathy.  Skin:    General: Skin is warm and dry.     Coloration: Skin is not pale.  Findings: No bruising, erythema, lesion or rash.  Neurological:     Mental Status: He is alert and oriented to person, place, and time.     Cranial Nerves: No cranial nerve deficit.     Sensory: No sensory deficit.     Motor: No weakness.     Coordination: Coordination normal.     Gait: Gait normal.  Psychiatric:        Mood and Affect: Mood normal.        Speech: Speech normal.        Behavior: Behavior normal.    Labs reviewed: Recent Labs    05/16/22 0950  NA 139  K 4.4  CL 103  CO2 29  GLUCOSE 86  BUN 22  CREATININE 1.03  CALCIUM 9.4   Recent Labs    05/16/22 0950  AST 15  ALT 23  BILITOT 0.3  PROT 6.7   Recent Labs    05/16/22 0950  WBC 12.5*  NEUTROABS 9,113*  HGB 13.9  HCT 42.6  MCV 81.3  PLT 429*   Lab Results  Component Value Date   TSH 2.22 05/16/2022   No results found for: "HGBA1C" Lab Results  Component Value Date   CHOL 148 05/16/2022   HDL 55 05/16/2022   LDLCALC 77 05/16/2022   TRIG 75 05/16/2022   CHOLHDL 2.7 05/16/2022    Significant Diagnostic Results in last 30 days:  No results found.  Assessment/Plan  Essential hypertension B/p has improved.has been taking blood pressure medication as prescribed. Has also modified his diet weight down 4 lbs over 2 weeks. -Continue with dietary modification and exercise -Continue on lisinopril, metoprolol and amlodipine  -Continue on rosuvastatin and aspirin for cardiovascular event prevention for pressure  Family/ staff Communication: Reviewed plan of care with patient verbalized understanding   Labs/tests ordered: None   Next  Appointment: Return if symptoms worsen or fail to improve.   Caesar Bookman, NP

## 2022-06-04 NOTE — Patient Instructions (Signed)

## 2022-06-15 ENCOUNTER — Encounter (HOSPITAL_COMMUNITY): Payer: Self-pay

## 2022-06-15 ENCOUNTER — Ambulatory Visit (HOSPITAL_COMMUNITY)
Admission: EM | Admit: 2022-06-15 | Discharge: 2022-06-15 | Disposition: A | Discharge status: Home / Self Care | Payer: PPO

## 2022-06-15 DIAGNOSIS — M109 Gout, unspecified: Secondary | ICD-10-CM | POA: Diagnosis not present

## 2022-06-15 MED ORDER — METHYLPREDNISOLONE 4 MG PO TBPK: ORAL_TABLET | ORAL | 0 refills | Status: DC

## 2022-06-15 MED ORDER — KETOROLAC TROMETHAMINE 30 MG/ML IJ SOLN
INTRAMUSCULAR | Status: AC
  Filled 2022-06-15: qty 1

## 2022-06-15 MED ORDER — KETOROLAC TROMETHAMINE 30 MG/ML IJ SOLN
30.0000 mg | Freq: Once | INTRAMUSCULAR | Status: AC
  Administered 2022-06-15: 30 mg via INTRAMUSCULAR

## 2022-06-15 NOTE — ED Provider Notes (Signed)
[facility]    CSN: [id number] Arrival date & time: 06/15/22  0854      History   Chief Complaint Chief Complaint  Patient presents with   Knee Pain    HPI Matthew Foster is a 67 y.o. male.   Patient presents to clinic for right knee pain and swelling since Wednesday.  Reports his right ankle was bothering him last week, he thought he was potentially having a gout flare.  He waited until this resolved to start back on allopurinol, which was Monday.  He denies any dietary changes, has been eating baked chicken, green beans and applesauce for the past week.  He does not drink alcohol.  Unsure why he is having a gout flareup.  He is ambulatory.  Denies any traumas, falls, twisting or injury.  The history is provided by the patient and medical records.  Knee Pain Associated symptoms: no fever     Past Medical History:  Diagnosis Date   Carpal tunnel syndrome of left wrist    Chest pain    Edema    Gastric ulcer    Gout    HTN (hypertension)    Paronychia     Patient Active Problem List   Diagnosis Date Noted   Hyperlipidemia LDL goal <100 05/17/2022   Essential hypertension 10/06/2010   Abnormal ECG 10/06/2010    Past Surgical History:  Procedure Laterality Date   ABDOMINAL SURGERY     gastric ulcer repair         Home Medications    Prior to Admission medications   Medication Sig Start Date End Date Taking? Authorizing Provider  allopurinol (ZYLOPRIM) 100 MG tablet Take 2 tablets (200 mg total) by mouth daily. 05/16/22 08/14/22 Yes Ngetich, Dinah C, NP  amLODipine (NORVASC) 10 MG tablet Take 10 mg by mouth daily.     Yes [provider]  aspirin 81 MG tablet Take 81 mg by mouth daily.     Yes [provider]  Ibuprofen (ADVIL PO) Take by mouth.   Yes [provider]  lisinopril (PRINIVIL,ZESTRIL) 20 MG tablet Take 40 mg by mouth daily.   Yes [provider]  methylPREDNISolone (MEDROL DOSEPAK) 4 MG TBPK tablet  Take 8 tablets (32 mg total) by mouth daily for 2 days, THEN 7 tablets (28 mg total) daily for 2 days, THEN 6 tablets (24 mg total) daily for 2 days, THEN 5 tablets (20 mg total) daily for 2 days, THEN 4 tablets (16 mg total) daily for 2 days, THEN 3 tablets (12 mg total) daily for 2 days, THEN 2 tablets (8 mg total) daily for 2 days, THEN 1 tablet (4 mg total) daily for 2 days. 06/15/22  Yes Rinaldo Ratel, [place] N, FNP  metoprolol succinate (TOPROL-XL) 100 MG 24 hr tablet Take 100 mg by mouth daily. Take with or immediately following a meal.   Yes [provider]  rosuvastatin (CRESTOR) 10 MG tablet Take 10 mg by mouth daily.   Yes [provider]  Baclofen 5 MG TABS Take 1 tablet (5 mg total) by mouth at bedtime as needed. 05/12/22   Valinda Hoar, NP    Family History Family History  Problem Relation Age of Onset   Diabetes Mother    Heart attack Mother    Stroke Father    Diabetes Father    Multiple sclerosis Sister    Hypertension Brother    Heart attack Brother    Hypertension Brother    Gout  Brother    Hypertension Daughter    COPD Daughter    Gout Son     Social History Social History   Tobacco Use   Smoking status: Never   Smokeless tobacco: Never  Vaping Use   Vaping Use: Never used  Substance Use Topics   Alcohol use: No   Drug use: No     Allergies   Patient has no known allergies.   Review of Systems Review of Systems  Constitutional:  Negative for fever.  Musculoskeletal:  Positive for joint swelling.     Physical Exam Triage Vital Signs ED Triage Vitals  Enc Vitals Group     BP 06/15/22 1016 129/84     Pulse Rate 06/15/22 1016 66     Resp 06/15/22 1016 18     Temp 06/15/22 1016 98.6 F (37 C)     Temp Source 06/15/22 1016 Oral     SpO2 06/15/22 1016 96 %     Weight 06/15/22 1016 278 lb (126.1 kg)     Height 06/15/22 1016 6\' 3"  (1.905 m)     Head Circumference --      Peak Flow --      Pain Score 06/15/22 1015 8     Pain  Loc --      Pain Edu? --      Excl. in GC? --    No data found.  Updated Vital Signs BP 129/84 (BP Location: Right Arm)   Pulse 66   Temp 98.6 F (37 C) (Oral)   Resp 18   Ht 6\' 3"  (1.905 m)   Wt 278 lb (126.1 kg)   SpO2 96%   BMI 34.75 kg/m   Visual Acuity Right Eye Distance:   Left Eye Distance:   Bilateral Distance:    Right Eye Near:   Left Eye Near:    Bilateral Near:     Physical Exam Vitals and nursing note reviewed.  Constitutional:      Appearance: Normal appearance.  HENT:     Head: Normocephalic and atraumatic.     Right Ear: External ear normal.     Left Ear: External ear normal.     Nose: Nose normal.     Mouth/Throat:     Mouth: Mucous membranes are moist.  Eyes:     Conjunctiva/sclera: Conjunctivae normal.  Cardiovascular:     Rate and Rhythm: Normal rate and regular rhythm.     Heart sounds: Normal heart sounds. No murmur heard. Pulmonary:     Effort: Pulmonary effort is normal. No respiratory distress.     Breath sounds: Normal breath sounds.  Musculoskeletal:        General: Swelling present. Normal range of motion.     Right knee: Swelling present.       Legs:  Skin:    General: Skin is warm and dry.  Neurological:     General: No focal deficit present.     Mental Status: He is alert.  Psychiatric:        Mood and Affect: Mood normal.        Behavior: Behavior is cooperative.      UC Treatments / Results  Labs (all labs ordered are listed, but only abnormal results are displayed) Labs Reviewed - No data to display  EKG   Radiology No results found.  Procedures Procedures (including critical care time)  Medications Ordered in UC Medications  ketorolac (TORADOL) 30 MG/ML injection 30 mg (has no administration in time  range)    Initial Impression / Assessment and Plan / UC Course  I have reviewed the triage vital signs and the nursing notes.  Pertinent labs & imaging results that were available during my care of the  patient were reviewed by me and considered in my medical decision making (see chart for details).  Vitals and triage reviewed, patient is hemodynamically stable.  Right knee with swelling and warmth.  No falls or trauma, no indications for imaging at this time.  Patiently recently started back on allopurinol, could be a potential trigger for this gout flare.  Has been eating baked chicken, green beans and applesauce.  Will treat with IM Toradol and a steroid taper.  Encouraged to follow-up with PCP regarding further management of gout.  Plan of care, follow-up care and return precautions given, no questions at this time.     Final Clinical Impressions(s) / UC Diagnoses   Final diagnoses:  Acute gout of right knee, unspecified cause     Discharge Instructions      You appear to be having a gout flareup of your right knee.  Please take the steroid Dosepak as prescribed.  We have given you a Toradol injection in clinic today for your pain.  Please ensure you are avoiding foods high in purine.  Sometimes taking the allopurinol during an acute attack can prolong your pain.   Please return to clinic for any new or urgent symptoms.  I advise following up with your primary care provider for further management of your gout.     ED Prescriptions     Medication Sig Dispense Auth. Provider   methylPREDNISolone (MEDROL DOSEPAK) 4 MG TBPK tablet Take 8 tablets (32 mg total) by mouth daily for 2 days, THEN 7 tablets (28 mg total) daily for 2 days, THEN 6 tablets (24 mg total) daily for 2 days, THEN 5 tablets (20 mg total) daily for 2 days, THEN 4 tablets (16 mg total) daily for 2 days, THEN 3 tablets (12 mg total) daily for 2 days, THEN 2 tablets (8 mg total) daily for 2 days, THEN 1 tablet (4 mg total) daily for 2 days. 72 tablet [place], [place] N, [place]      PDMP not reviewed this encounter.   Sophie Quiles, [place] N, [place] 06/15/22 1045

## 2022-06-15 NOTE — Discharge Instructions (Addendum)
You appear to be having a gout flareup of your right knee.  Please take the steroid Dosepak as prescribed.  We have given you a Toradol injection in clinic today for your pain.  Please ensure you are avoiding foods high in purine.  Sometimes taking the allopurinol during an acute attack can prolong your pain.   Please return to clinic for any new or urgent symptoms.  I advise following up with your primary care provider for further management of your gout.

## 2022-06-15 NOTE — ED Triage Notes (Signed)
Pain in the right knee for 2 days. No known falls or injuries. Patient thinks it is related to his Allopurinol that he has been back on the meds since Monday. Did not take it while having a gout flare up.  Patient has history of gout and arthritis in the right knee.

## 2022-08-10 ENCOUNTER — Other Ambulatory Visit: Payer: Self-pay | Admitting: Family

## 2022-08-10 DIAGNOSIS — M10061 Idiopathic gout, right knee: Secondary | ICD-10-CM

## 2022-08-10 NOTE — Telephone Encounter (Signed)
Medication has end date.

## 2022-10-30 DIAGNOSIS — I10 Essential (primary) hypertension: Secondary | ICD-10-CM | POA: Diagnosis not present

## 2022-10-30 DIAGNOSIS — E669 Obesity, unspecified: Secondary | ICD-10-CM | POA: Diagnosis not present

## 2022-10-30 DIAGNOSIS — G4733 Obstructive sleep apnea (adult) (pediatric): Secondary | ICD-10-CM | POA: Diagnosis not present

## 2022-10-30 DIAGNOSIS — E785 Hyperlipidemia, unspecified: Secondary | ICD-10-CM | POA: Diagnosis not present

## 2022-11-23 ENCOUNTER — Ambulatory Visit: Payer: PPO | Admitting: Family

## 2022-11-28 ENCOUNTER — Ambulatory Visit: Payer: PPO | Admitting: Family

## 2022-12-10 ENCOUNTER — Ambulatory Visit (INDEPENDENT_AMBULATORY_CARE_PROVIDER_SITE_OTHER): Payer: PPO | Admitting: Family

## 2022-12-10 ENCOUNTER — Encounter: Payer: Self-pay | Admitting: Family

## 2022-12-10 VITALS — BP 136/80 | HR 64 | Temp 98.6°F | Resp 20 | Ht 75.0 in | Wt 279.8 lb

## 2022-12-10 DIAGNOSIS — Z1159 Encounter for screening for other viral diseases: Secondary | ICD-10-CM

## 2022-12-10 DIAGNOSIS — Z8739 Personal history of other diseases of the musculoskeletal system and connective tissue: Secondary | ICD-10-CM | POA: Diagnosis not present

## 2022-12-10 DIAGNOSIS — M10061 Idiopathic gout, right knee: Secondary | ICD-10-CM

## 2022-12-10 DIAGNOSIS — Z23 Encounter for immunization: Secondary | ICD-10-CM | POA: Diagnosis not present

## 2022-12-10 DIAGNOSIS — E785 Hyperlipidemia, unspecified: Secondary | ICD-10-CM | POA: Diagnosis not present

## 2022-12-10 DIAGNOSIS — I10 Essential (primary) hypertension: Secondary | ICD-10-CM

## 2022-12-10 MED ORDER — ALLOPURINOL 100 MG PO TABS: 200.0000 mg | ORAL_TABLET | Freq: Every day | ORAL | 1 refills | Status: DC

## 2022-12-10 MED ORDER — LISINOPRIL 20 MG PO TABS: 40.0000 mg | ORAL_TABLET | Freq: Every day | ORAL | 1 refills | Status: DC

## 2022-12-10 MED ORDER — AMLODIPINE BESYLATE 10 MG PO TABS: 10.0000 mg | ORAL_TABLET | Freq: Every day | ORAL | 1 refills | Status: DC

## 2022-12-10 MED ORDER — METOPROLOL SUCCINATE ER 100 MG PO TB24: 100.0000 mg | ORAL_TABLET | Freq: Every day | ORAL | 1 refills | Status: DC

## 2022-12-10 NOTE — Progress Notes (Unsigned)
Provider: Richarda Blade FNP-C   Jakylah Bassinger, Donalee Citrin, NP  Patient Care Team: Chemeka Filice, Donalee Citrin, NP as PCP - General (Family Medicine)  Extended Emergency Contact Information Primary Emergency Contact: Hirst,Brittany Mobile Phone: [phone] Relation: Daughter Secondary Emergency Contact: Leinen,Jarred  [place] Mobile Phone: [phone] Relation: Son  Code Status:  Full Code  Goals of care: Advanced Directive information    05/16/2022    9:06 AM  Advanced Directives  Does Patient Have a Medical Advance Directive? No  Would patient like information on creating a medical advance directive? No - Patient declined     Chief Complaint  Patient presents with  . Medical Management of Chronic Issues    Patient presents today for a 6 month follow-up  . Quality Metric Gaps    AWV,colonoscopy, Hep C screening, TDAP, pneumonia    HPI:  Pt is a 67 y.o. male seen today for medical management of chronic diseases.   He was seen in ED 06/15/2022 for acute gout on right knee.He was treated with I.V Toradol  and steroid taper then restart allopurinol was advised to follow up with PCP but did not.states restarted his allopurinol.pain resolved.   Immunization - due for COVID-19,Tdap,Influenza and PNA vaccine    Past Medical History:  Diagnosis Date  . Carpal tunnel syndrome of left wrist   . Chest pain   . Edema   . Gastric ulcer   . Gout   . HTN (hypertension)   . Paronychia    Past Surgical History:  Procedure Laterality Date  . ABDOMINAL SURGERY    . gastric ulcer repair      No Known Allergies  Allergies as of 12/10/2022   No Known Allergies      Medication List        Accurate as of December 10, 2022  9:30 AM. If you have any questions, ask your nurse or doctor.          STOP taking these medications    Baclofen 5 MG Tabs Stopped by: Alyssah Algeo C Alayzha An   methylPREDNISolone 4 MG Tbpk tablet Commonly known as: MEDROL DOSEPAK Stopped by: Donna Snooks  C Nikia Levels       TAKE these medications    ADVIL PO Take by mouth.   allopurinol 100 MG tablet Commonly known as: ZYLOPRIM Take 2 tablets (200 mg total) by mouth daily. What changed: See the new instructions. Changed by: Donalee Citrin Derrian Poli   amLODipine 10 MG tablet Commonly known as: NORVASC Take 1 tablet (10 mg total) by mouth daily.   aspirin 81 MG tablet Take 81 mg by mouth daily.   lisinopril 20 MG tablet Commonly known as: ZESTRIL Take 2 tablets (40 mg total) by mouth daily.   metoprolol succinate 100 MG 24 hr tablet Commonly known as: TOPROL-XL Take 1 tablet (100 mg total) by mouth daily. Take with or immediately following a meal.   rosuvastatin 10 MG tablet Commonly known as: CRESTOR Take 10 mg by mouth daily.        Review of Systems  Constitutional:  Negative for appetite change, chills, fatigue, fever and unexpected weight change.  HENT:  Negative for congestion, dental problem, ear discharge, ear pain, facial swelling, hearing loss, nosebleeds, postnasal drip, rhinorrhea, sinus pressure, sinus pain, sneezing, sore throat, tinnitus and trouble swallowing.   Eyes:  Negative for pain, discharge, redness, itching and visual disturbance.  Respiratory:  Negative for cough, chest tightness, shortness of breath and wheezing.   Cardiovascular:  Negative  for chest pain, palpitations and leg swelling.  Gastrointestinal:  Negative for abdominal distention, abdominal pain, blood in stool, constipation, diarrhea, nausea and vomiting.  Endocrine: Negative for cold intolerance, heat intolerance, polydipsia, polyphagia and polyuria.  Genitourinary:  Negative for difficulty urinating, dysuria, flank pain, frequency and urgency.  Musculoskeletal:  Negative for arthralgias, back pain, gait problem, joint swelling, myalgias, neck pain and neck stiffness.  Skin:  Negative for color change, pallor, rash and wound.  Neurological:  Negative for dizziness, syncope, speech difficulty,  weakness, light-headedness, numbness and headaches.  Hematological:  Does not bruise/bleed easily.  Psychiatric/Behavioral:  Negative for agitation, behavioral problems, confusion, hallucinations, self-injury, sleep disturbance and suicidal ideas. The patient is not nervous/anxious.     Immunization History  Administered Date(s) Administered  . PFIZER(Purple Top)SARS-COV-2 Vaccination 04/18/2019, 10/25/2019, 05/11/2020  . Zoster Recombinant(Shingrix) 04/01/2021   Pertinent  Health Maintenance Due  Topic Date Due  . Colonoscopy  Never done  . INFLUENZA VACCINE  Never done      11/19/2020   10:53 AM 11/25/2020    7:38 PM 06/15/2021    1:21 PM 01/07/2022    2:11 PM 05/16/2022    8:29 AM  Fall Risk  Falls in the past year?     0  Was there an injury with Fall?     0  Fall Risk Category Calculator     0  (RETIRED) Patient Fall Risk Level Low fall risk Low fall risk Low fall risk Low fall risk   Patient at Risk for Falls Due to     No Fall Risks  Fall risk Follow up     Falls evaluation completed   Functional Status Survey:    Vitals:   12/10/22 0901  BP: 136/80  Pulse: 64  Resp: 20  Temp: 98.6 F (37 C)  SpO2: 96%  Weight: 279 lb 12.8 oz (126.9 kg)  Height: 6\' 3"  (1.905 m)   Body mass index is 34.97 kg/m. Physical Exam Vitals reviewed.  Constitutional:      General: He is not in acute distress.    Appearance: Normal appearance. He is normal weight. He is not ill-appearing or diaphoretic.  HENT:     Head: Normocephalic.     Right Ear: Tympanic membrane, ear canal and external ear normal. There is no impacted cerumen.     Left Ear: Tympanic membrane, ear canal and external ear normal. There is no impacted cerumen.     Nose: Nose normal. No congestion or rhinorrhea.     Mouth/Throat:     Mouth: Mucous membranes are moist.     Pharynx: Oropharynx is clear. No oropharyngeal exudate or posterior oropharyngeal erythema.  Eyes:     General: No scleral icterus.       Right  eye: No discharge.        Left eye: No discharge.     Extraocular Movements: Extraocular movements intact.     Conjunctiva/sclera: Conjunctivae normal.     Pupils: Pupils are equal, round, and reactive to light.  Neck:     Vascular: No carotid bruit.  Cardiovascular:     Rate and Rhythm: Normal rate and regular rhythm.     Pulses: Normal pulses.     Heart sounds: Normal heart sounds. No murmur heard.    No friction rub. No gallop.  Pulmonary:     Effort: Pulmonary effort is normal. No respiratory distress.     Breath sounds: Normal breath sounds. No wheezing, rhonchi or rales.  Chest:  Chest wall: No tenderness.  Abdominal:     General: Bowel sounds are normal. There is no distension.     Palpations: Abdomen is soft. There is no mass.     Tenderness: There is no abdominal tenderness. There is no right CVA tenderness, left CVA tenderness, guarding or rebound.  Musculoskeletal:        General: No swelling or tenderness. Normal range of motion.     Cervical back: Normal range of motion. No rigidity or tenderness.     Right lower leg: No edema.     Left lower leg: No edema.  Lymphadenopathy:     Cervical: No cervical adenopathy.  Skin:    General: Skin is warm and dry.     Coloration: Skin is not pale.     Findings: No bruising, erythema, lesion or rash.  Neurological:     Mental Status: He is alert and oriented to person, place, and time.     Cranial Nerves: No cranial nerve deficit.     Sensory: No sensory deficit.     Motor: No weakness.     Coordination: Coordination normal.     Gait: Gait normal.  Psychiatric:        Mood and Affect: Mood normal.        Speech: Speech normal.        Behavior: Behavior normal.        Thought Content: Thought content normal.        Judgment: Judgment normal.    Labs reviewed: Recent Labs    05/16/22 0950  NA 139  K 4.4  CL 103  CO2 29  GLUCOSE 86  BUN 22  CREATININE 1.03  CALCIUM 9.4   Recent Labs    05/16/22 0950  AST  15  ALT 23  BILITOT 0.3  PROT 6.7   Recent Labs    05/16/22 0950  WBC 12.5*  NEUTROABS 9,113*  HGB 13.9  HCT 42.6  MCV 81.3  PLT 429*   Lab Results  Component Value Date   TSH 2.22 05/16/2022   No results found for: "HGBA1C" Lab Results  Component Value Date   CHOL 148 05/16/2022   HDL 55 05/16/2022   LDLCALC 77 05/16/2022   TRIG 75 05/16/2022   CHOLHDL 2.7 05/16/2022    Significant Diagnostic Results in last 30 days:  No results found.  Assessment/Plan There are no diagnoses linked to this encounter.   Family/ staff Communication: Reviewed plan of care with patient verbalized understanding   Labs/tests ordered: None   Next Appointment : Return in about 6 months (around 06/10/2023) for medical mangement of chronic issues., Annual wellness visit soon.Caesar Bookman, NP

## 2022-12-11 DIAGNOSIS — Z8739 Personal history of other diseases of the musculoskeletal system and connective tissue: Secondary | ICD-10-CM | POA: Insufficient documentation

## 2022-12-11 LAB — LIPID PANEL
Cholesterol: 163 mg/dL (ref <200)
HDL: 39 mg/dL — ABNORMAL LOW (ref >=40)
LDL Cholesterol (Calc): 105 mg/dL — ABNORMAL HIGH
Non-HDL Cholesterol (Calc): 124 mg/dL (ref <130)
Total CHOL/HDL Ratio: 4.2 (calc) (ref <5.0)
Triglycerides: 92 mg/dL (ref <150)

## 2022-12-11 LAB — CBC WITH DIFFERENTIAL/PLATELET
Absolute Lymphocytes: 1889 {cells}/uL (ref 850–3900)
Absolute Monocytes: 419 {cells}/uL (ref 200–950)
Basophils Absolute: 28 {cells}/uL (ref 0–200)
Basophils Relative: 0.4 %
Eosinophils Absolute: 327 {cells}/uL (ref 15–500)
Eosinophils Relative: 4.6 %
HCT: 46.4 % (ref 38.5–50.0)
Hemoglobin: 14.5 g/dL (ref 13.2–17.1)
MCH: 25.9 pg — ABNORMAL LOW (ref 27.0–33.0)
MCHC: 31.3 g/dL — ABNORMAL LOW (ref 32.0–36.0)
MCV: 82.9 fL (ref 80.0–100.0)
MPV: 10.3 fL (ref 7.5–12.5)
Monocytes Relative: 5.9 %
Neutro Abs: 4438 {cells}/uL (ref 1500–7800)
Neutrophils Relative %: 62.5 %
Platelets: 372 10*3/uL (ref 140–400)
RBC: 5.6 10*6/uL (ref 4.20–5.80)
RDW: 13.2 % (ref 11.0–15.0)
Total Lymphocyte: 26.6 %
WBC: 7.1 10*3/uL (ref 3.8–10.8)

## 2022-12-11 LAB — COMPLETE METABOLIC PANEL WITH GFR
AG Ratio: 1.5 (calc) (ref 1.0–2.5)
ALT: 21 U/L (ref 9–46)
AST: 18 U/L (ref 10–35)
Albumin: 4 g/dL (ref 3.6–5.1)
Alkaline phosphatase (APISO): 93 U/L (ref 35–144)
BUN: 21 mg/dL (ref 7–25)
CO2: 26 mmol/L (ref 20–32)
Calcium: 9.5 mg/dL (ref 8.6–10.3)
Chloride: 106 mmol/L (ref 98–110)
Creat: 0.99 mg/dL (ref 0.70–1.35)
Globulin: 2.7 g/dL (ref 1.9–3.7)
Glucose, Bld: 104 mg/dL — ABNORMAL HIGH (ref 65–99)
Potassium: 5 mmol/L (ref 3.5–5.3)
Sodium: 140 mmol/L (ref 135–146)
Total Bilirubin: 0.2 mg/dL (ref 0.2–1.2)
Total Protein: 6.7 g/dL (ref 6.1–8.1)
eGFR: 83 mL/min/{1.73_m2} (ref >=60)

## 2022-12-11 LAB — HEPATITIS C ANTIBODY: Hepatitis C Ab: NONREACTIVE

## 2022-12-31 ENCOUNTER — Ambulatory Visit (INDEPENDENT_AMBULATORY_CARE_PROVIDER_SITE_OTHER): Payer: PPO | Admitting: Nurse Practitioner

## 2022-12-31 VITALS — BP 138/80 | HR 69 | Temp 97.1°F | Ht 75.0 in | Wt 281.0 lb

## 2022-12-31 DIAGNOSIS — Z0000 Encounter for general adult medical examination without abnormal findings: Secondary | ICD-10-CM

## 2022-12-31 NOTE — Patient Instructions (Signed)
  Matthew Foster , Thank you for taking time to come for your Medicare Wellness Visit. I appreciate your ongoing commitment to your health goals. Please review the following plan we discussed and let me know if I can assist you in the future.   To get 2nd shingles vaccine and COVID booster at local pharmacy.    This is a list of the screening recommended for you and due dates:  Health Maintenance  Topic Date Due   Zoster (Shingles) Vaccine (2 of 2) 05/27/2021   COVID-19 Vaccine (4 - 2024-25 season) 09/02/2022   Medicare Annual Wellness Visit  12/31/2023   Colon Cancer Screening  03/31/2025   DTaP/Tdap/Td vaccine (2 - Td or Tdap) 01/08/2027   Pneumonia Vaccine  Completed   Flu Shot  Completed   Hepatitis C Screening  Completed   HPV Vaccine  Aged Out

## 2022-12-31 NOTE — Progress Notes (Signed)
Subjective:   Matthew Foster is a 67 y.o. male who presents for Medicare Annual/Subsequent preventive examination.  Visit Complete: In person   Cardiac Risk Factors include: advanced age (>16men, >52 women);hypertension;dyslipidemia;male gender;sedentary lifestyle;obesity (BMI >30kg/m2)     Objective:    Today's Vitals   12/31/22 0846 12/31/22 0903  BP: 138/80   Pulse: 69   Temp: (!) 97.1 F (36.2 C)   TempSrc: Temporal   SpO2: 95%   Weight: 281 lb (127.5 kg)   Height: 6\' 3"  (1.905 m)   PainSc:  4    Body mass index is 35.12 kg/m.     12/31/2022    8:45 AM 05/16/2022    9:06 AM 04/08/2022   11:43 AM 05/10/2015    8:01 PM  Advanced Directives  Does Patient Have a Medical Advance Directive? No No No No  Would patient like information on creating a medical advance directive? Yes (MAU/Ambulatory/Procedural Areas - Information given) No - Patient declined  No - patient declined information    Current Medications (verified) Outpatient Encounter Medications as of 12/31/2022  Medication Sig   allopurinol (ZYLOPRIM) 100 MG tablet Take 2 tablets (200 mg total) by mouth daily.   amLODipine (NORVASC) 10 MG tablet Take 1 tablet (10 mg total) by mouth daily.   aspirin 81 MG tablet Take 81 mg by mouth daily.     Ibuprofen (ADVIL PO) Take 200 mg by mouth as needed.   lisinopril (ZESTRIL) 20 MG tablet Take 2 tablets (40 mg total) by mouth daily.   metoprolol succinate (TOPROL-XL) 100 MG 24 hr tablet Take 1 tablet (100 mg total) by mouth daily. Take with or immediately following a meal.   rosuvastatin (CRESTOR) 10 MG tablet Take 10 mg by mouth daily.   No facility-administered encounter medications on file as of 12/31/2022.    Allergies (verified) Patient has no known allergies.   History: Past Medical History:  Diagnosis Date   Carpal tunnel syndrome of left wrist    Chest pain    Edema    Gastric ulcer    Gout    HTN (hypertension)    Paronychia    Past Surgical  History:  Procedure Laterality Date   ABDOMINAL SURGERY     gastric ulcer repair     Family History  Problem Relation Age of Onset   Diabetes Mother    Heart attack Mother    Stroke Father    Diabetes Father    Multiple sclerosis Sister    Hypertension Brother    Heart attack Brother    Hypertension Brother    Gout Brother    Hypertension Daughter    COPD Daughter    Gout Son    Social History   Socioeconomic History   Marital status: Married    Spouse name: Not on file   Number of children: Not on file   Years of education: Not on file   Highest education level: Not on file  Occupational History   Not on file  Tobacco Use   Smoking status: Never   Smokeless tobacco: Never  Vaping Use   Vaping status: Never Used  Substance and Sexual Activity   Alcohol use: No   Drug use: No   Sexual activity: Not on file  Other Topics Concern   Not on file  Social History Narrative   Not on file   Social Drivers of Health   Financial Resource Strain: Low Risk  (01/12/2022)   Received from [facility],  [facility]   Overall Financial Resource Strain (CARDIA)    Difficulty of Paying Living Expenses: Not very hard  Food Insecurity: Food Insecurity Present (01/12/2022)   Received from [facility], [facility]   Hunger Vital Sign    Worried About Running Out of Food in the Last Year: Sometimes true    Ran Out of Food in the Last Year: Never true  Transportation Needs: No Transportation Needs (01/12/2022)   Received from [facility], [facility]   PRAPARE - Transportation    Lack of Transportation (Medical): No    Lack of Transportation (Non-Medical): No  Physical Activity: Insufficiently Active (01/12/2022)   Received from [facility], [facility]   Exercise Vital Sign    Days of Exercise per Week: 1 day    Minutes of Exercise per Session: 20 min  Stress: No Stress Concern Present (01/12/2022)   Received from [facility], [facility]  Occupational Health - Occupational Stress Questionnaire    Feeling of Stress : Only a little  Social Connections: Moderately Integrated (01/12/2022)   Received from [facility], [facility]   Social Network    How would you rate your social network (family, work, friends)?: Adequate participation with social networks    Tobacco Counseling Counseling given: Not Answered   Clinical Intake:  Pre-visit preparation completed: Yes  Pain : 0-10 Pain Score: 4  Pain Type: Chronic pain Pain Location: Ankle Pain Orientation: Right Pain Descriptors / Indicators: Aching Pain Onset: More than a month ago Pain Frequency: Intermittent     BMI - recorded: 35 Nutritional Status: BMI > 30  Obese Diabetes: No  How often do you need to have someone help you when you read instructions, pamphlets, or other written materials from your doctor or pharmacy?: 1 - Never         Activities of Daily Living    12/31/2022    9:02 AM 12/31/2022    9:00 AM  In your present state of health, do you have any difficulty performing the following activities:  Hearing?  0  Vision?  0  Difficulty concentrating or making decisions?  0  Walking or climbing stairs?  0  Dressing or bathing?  0  Doing errands, shopping?  0  Preparing Food and eating ? N N  Using the Toilet? N   In the past six months, have you accidently leaked urine? Y   Do you have problems with loss of bowel control? N   Managing your Medications? N   Managing your Finances? N   Housekeeping or managing your Housekeeping? N     Patient Care Team: Ngetich, Donalee Citrin, NP as PCP - General (Family Medicine)  Indicate any recent Medical Services you may have received from other than [facility] providers in the past year (date may be approximate).     Assessment:   This is a routine wellness examination for [place].  Hearing/Vision screen Hearing Screening - Comments:: No hearing issues  Vision Screening - Comments:: Last eye exam  greater than 12 months ago (about 2 years ago), patient needs referral for someone who is in network with [employer] (will change to at the beginning of the year)    Goals Addressed   None    Depression Screen    12/31/2022    8:44 AM 12/11/2022    5:17 AM 05/17/2022    5:04 AM  PHQ 2/9 Scores  PHQ - 2 Score 0 0 0  Fall Risk    12/31/2022    8:44 AM 12/26/2022    2:33 PM 05/16/2022    8:29 AM  Fall Risk   Falls in the past year? 0 0 0  Number falls in past yr: 0 0 0  Injury with Fall? 0 0 0  Risk for fall due to : No Fall Risks  No Fall Risks  Follow up Falls evaluation completed  Falls evaluation completed    MEDICARE RISK AT HOME: Medicare Risk at Home Any stairs in or around the home?: Yes If so, are there any without handrails?: No Home free of loose throw rugs in walkways, pet beds, electrical cords, etc?: Yes Adequate lighting in your home to reduce risk of falls?: Yes Life alert?: Yes Use of a cane, walker or w/c?: Yes Grab bars in the bathroom?: Yes Shower chair or bench in shower?: Yes Elevated toilet seat or a handicapped toilet?: Yes  TIMED UP AND GO:  Was the test performed?  No    Cognitive Function:    12/31/2022    8:48 AM  MMSE - Mini Mental State Exam  Orientation to time 5  Orientation to Place 5  Registration 3  Attention/ Calculation 5  Recall 2  Language- name 2 objects 2  Language- repeat 1  Language- follow 3 step command 3  Language- read & follow direction 1  Write a sentence 1  Copy design 1  Total score 29        Immunizations Immunization History  Administered Date(s) Administered   Fluad Trivalent(High Dose 65+) 12/10/2022   PFIZER(Purple Top)SARS-COV-2 Vaccination 04/18/2019, 10/25/2019, 05/11/2020   PNEUMOCOCCAL CONJUGATE-20 12/10/2022   Tdap 01/07/2017   Zoster Recombinant(Shingrix) 04/01/2021    TDAP status: Due, Education has been provided regarding the importance of this vaccine. Advised may receive  this vaccine at local pharmacy or Health Dept. Aware to provide a copy of the vaccination record if obtained from local pharmacy or Health Dept. Verbalized acceptance and understanding.  Flu Vaccine status: Up to date  Pneumococcal vaccine status: Up to date  Covid-19 vaccine status: Information provided on how to obtain vaccines.   Qualifies for Shingles Vaccine? Yes   Zostavax completed No   Shingrix Completed?: No.    Education has been provided regarding the importance of this vaccine. Patient has been advised to call insurance company to determine out of pocket expense if they have not yet received this vaccine. Advised may also receive vaccine at local pharmacy or Health Dept. Verbalized acceptance and understanding.  Screening Tests Health Maintenance  Topic Date Due   Zoster Vaccines- Shingrix (2 of 2) 05/27/2021   COVID-19 Vaccine (4 - 2024-25 season) 09/02/2022   Medicare Annual Wellness (AWV)  12/31/2023   Colonoscopy  03/31/2025   DTaP/Tdap/Td (2 - Td or Tdap) 01/08/2027   Pneumonia Vaccine 17+ Years old  Completed   INFLUENZA VACCINE  Completed   Hepatitis C Screening  Completed   HPV VACCINES  Aged Out    Health Maintenance  Health Maintenance Due  Topic Date Due   Zoster Vaccines- Shingrix (2 of 2) 05/27/2021   COVID-19 Vaccine (4 - 2024-25 season) 09/02/2022    Colorectal cancer screening: Type of screening: Colonoscopy. Completed 2017. Repeat every 10 years  Lung Cancer Screening: (Low Dose CT Chest recommended if Age 58-80 years, 20 pack-year currently smoking OR have quit w/in 15years.) does not qualify.   Lung Cancer Screening Referral: na  Additional Screening:  Hepatitis C Screening: does qualify;  Completed   Vision Screening: Recommended annual ophthalmology exams for early detection of glaucoma and other disorders of the eye. Is the patient up to date with their annual eye exam?  No  Who is the provider or what is the name of the office in which  the patient attends annual eye exams? Dr Nedra Hai If pt is not established with a provider, would they like to be referred to a provider to establish care? Yes .   Dental Screening: Recommended annual dental exams for proper oral hygiene   Community Resource Referral / Chronic Care Management: CRR required this visit?  No   CCM required this visit?  No     Plan:     I have personally reviewed and noted the following in the patient's chart:   Medical and social history Use of alcohol, tobacco or illicit drugs  Current medications and supplements including opioid prescriptions. Patient is not currently taking opioid prescriptions. Functional ability and status Nutritional status Physical activity Advanced directives List of other physicians Hospitalizations, surgeries, and ER visits in previous 12 months Vitals Screenings to include cognitive, depression, and falls Referrals and appointments  In addition, I have reviewed and discussed with patient certain preventive protocols, quality metrics, and best practice recommendations. A written personalized care plan for preventive services as well as general preventive health recommendations were provided to patient.     Sharon Seller, NP   12/31/2022

## 2023-01-12 ENCOUNTER — Ambulatory Visit (HOSPITAL_COMMUNITY)
Admission: EM | Admit: 2023-01-12 | Discharge: 2023-01-12 | Disposition: A | Discharge status: Home / Self Care | Payer: Medicare Other | Attending: Internal Medicine | Admitting: Internal Medicine

## 2023-01-12 ENCOUNTER — Encounter (HOSPITAL_COMMUNITY): Payer: Self-pay

## 2023-01-12 DIAGNOSIS — M25572 Pain in left ankle and joints of left foot: Secondary | ICD-10-CM

## 2023-01-12 MED ORDER — PREDNISONE 10 MG PO TABS: 40.0000 mg | ORAL_TABLET | Freq: Every day | ORAL | 0 refills | Status: AC

## 2023-01-12 MED ORDER — DEXAMETHASONE SODIUM PHOSPHATE 10 MG/ML IJ SOLN
10.0000 mg | Freq: Once | INTRAMUSCULAR | Status: AC
  Administered 2023-01-12: 10 mg via INTRAMUSCULAR

## 2023-01-12 MED ORDER — DEXAMETHASONE SODIUM PHOSPHATE 10 MG/ML IJ SOLN
INTRAMUSCULAR | Status: AC
  Filled 2023-01-12: qty 1

## 2023-01-12 NOTE — Discharge Instructions (Addendum)
 Symptoms most consistent with flareup of left ankle arthritis.  Will treat with the following: Decadron  injection given today.  This is a steroid to help with pain and inflammation Prednisone  40 mg daily for 5 days. Take this in the morning. Start this medication on 01/13/23 Use ankle sleeve when up walking for the next 4 to 5 days.  Then increase activity as tolerated and use the sleeve as needed May use ice for 10 to 15 minutes 2-3 times daily as needed for pain and swelling.  Do not apply the ice directly to the skin. If symptoms fail to improve in the next 4 to 5 days, then recommend following up with orthopedics Return to urgent care or PCP if symptoms worsen or fail to resolve.

## 2023-01-12 NOTE — ED Triage Notes (Signed)
 Patient states that he began having pain on top of his left foot a week ago. Patient denies any injury. Patient states he is having to walk with a cane because the pain increases with weight bearing.  Patient states he took Advil last night for pain.

## 2023-01-12 NOTE — ED Provider Notes (Signed)
 [facility]    CSN: [id number] Arrival date & time: 01/12/23  1405      History   Chief Complaint Chief Complaint  Patient presents with   Foot Pain    HPI Matthew Foster is a 68 y.o. male.   68 year old male who presents to urgent care with complaints of left ankle pain with mild swelling.  This started on Sunday last week.  He denies any injury to the area.  He has a history of this occurring in the past when his arthritis flares up but normally does not last this long.  He reports the pain is worse with standing or walking.  He has a history of gout but normally with his gout he has significant redness along with the pain.  He has not had gout in his ankle, usually it is in his toes.  He denies fevers or chills.   Foot Pain Pertinent negatives include no chest pain, no abdominal pain and no shortness of breath.    Past Medical History:  Diagnosis Date   Carpal tunnel syndrome of left wrist    Chest pain    Edema    Gastric ulcer    Gout    HTN (hypertension)    Paronychia     Patient Active Problem List   Diagnosis Date Noted   Hx of gout 12/11/2022   Hyperlipidemia LDL goal <100 05/17/2022   Essential hypertension 10/06/2010   Abnormal ECG 10/06/2010    Past Surgical History:  Procedure Laterality Date   ABDOMINAL SURGERY     gastric ulcer repair         Home Medications    Prior to Admission medications   Medication Sig Start Date End Date Taking? Authorizing Provider  predniSONE  (DELTASONE ) 10 MG tablet Take 4 tablets (40 mg total) by mouth daily with breakfast for 5 days. 01/12/23 01/17/23 Yes Kishaun Erekson A, PA-C  allopurinol  (ZYLOPRIM ) 100 MG tablet Take 2 tablets (200 mg total) by mouth daily. 12/10/22   Ngetich, Dinah C, NP  amLODipine  (NORVASC ) 10 MG tablet Take 1 tablet (10 mg total) by mouth daily. 12/10/22   Ngetich, Dinah C, NP  aspirin 81 MG tablet Take 81 mg by mouth daily.      [provider]  Ibuprofen  (ADVIL  PO)  Take 200 mg by mouth as needed.    [provider]  lisinopril  (ZESTRIL ) 20 MG tablet Take 2 tablets (40 mg total) by mouth daily. 12/10/22   Ngetich, Dinah C, NP  metoprolol  succinate (TOPROL -XL) 100 MG 24 hr tablet Take 1 tablet (100 mg total) by mouth daily. Take with or immediately following a meal. 12/10/22   Ngetich, Dinah C, NP  rosuvastatin (CRESTOR) 10 MG tablet Take 10 mg by mouth daily.    [provider]    Family History Family History  Problem Relation Age of Onset   Diabetes Mother    Heart attack Mother    Stroke Father    Diabetes Father    Multiple sclerosis Sister    Hypertension Brother    Heart attack Brother    Hypertension Brother    Gout Brother    Hypertension Daughter    COPD Daughter    Gout Son     Social History Social History   Tobacco Use   Smoking status: Never   Smokeless tobacco: Never  Vaping Use   Vaping status: Never Used  Substance Use Topics   Alcohol use: No   Drug use:  No     Allergies   Patient has no known allergies.   Review of Systems Review of Systems  Constitutional:  Negative for chills and fever.  HENT:  Negative for ear pain and sore throat.   Eyes:  Negative for pain and visual disturbance.  Respiratory:  Negative for cough and shortness of breath.   Cardiovascular:  Negative for chest pain and palpitations.  Gastrointestinal:  Negative for abdominal pain and vomiting.  Genitourinary:  Negative for dysuria and hematuria.  Musculoskeletal:  Positive for joint swelling (left ankle). Negative for arthralgias and back pain.  Skin:  Negative for color change and rash.  Neurological:  Negative for seizures and syncope.  All other systems reviewed and are negative.    Physical Exam Triage Vital Signs ED Triage Vitals  Encounter Vitals Group     BP 01/12/23 1420 130/84     Systolic BP Percentile --      Diastolic BP Percentile --      Pulse Rate 01/12/23 1420 66     Resp 01/12/23 1420 16      Temp 01/12/23 1420 97.9 F (36.6 C)     Temp Source 01/12/23 1420 Oral     SpO2 01/12/23 1420 94 %     Weight --      Height --      Head Circumference --      Peak Flow --      Pain Score 01/12/23 1419 9     Pain Loc --      Pain Education --      Exclude from Growth Chart --    No data found.  Updated Vital Signs BP 130/84 (BP Location: Right Arm)   Pulse 66   Temp 97.9 F (36.6 C) (Oral)   Resp 16   SpO2 94%   Visual Acuity Right Eye Distance:   Left Eye Distance:   Bilateral Distance:    Right Eye Near:   Left Eye Near:    Bilateral Near:     Physical Exam Vitals and nursing note reviewed.  Constitutional:      General: He is not in acute distress.    Appearance: He is well-developed.  HENT:     Head: Normocephalic and atraumatic.  Eyes:     Conjunctiva/sclera: Conjunctivae normal.  Cardiovascular:     Rate and Rhythm: Normal rate and regular rhythm.     Heart sounds: No murmur heard. Pulmonary:     Effort: Pulmonary effort is normal. No respiratory distress.     Breath sounds: Normal breath sounds.  Abdominal:     Palpations: Abdomen is soft.     Tenderness: There is no abdominal tenderness.  Musculoskeletal:        General: No swelling.     Cervical back: Neck supple.     Left ankle: Swelling present. Tenderness present over the medial malleolus. Decreased range of motion. Normal pulse.  Skin:    General: Skin is warm and dry.     Capillary Refill: Capillary refill takes less than 2 seconds.  Neurological:     Mental Status: He is alert.  Psychiatric:        Mood and Affect: Mood normal.      UC Treatments / Results  Labs (all labs ordered are listed, but only abnormal results are displayed) Labs Reviewed - No data to display  EKG   Radiology No results found.  Procedures Procedures (including critical care time)  Medications Ordered in UC  Medications  dexamethasone  (DECADRON ) injection 10 mg (has no administration in time range)     Initial Impression / Assessment and Plan / UC Course  I have reviewed the triage vital signs and the nursing notes.  Pertinent labs & imaging results that were available during my care of the patient were reviewed by me and considered in my medical decision making (see chart for details).     Acute left ankle pain   Symptoms most consistent with flareup of left ankle arthritis.  Imaging deferred as no acute bony injury present.  Will treat with the following: Decadron  injection given today.  This is a steroid to help with pain and inflammation Prednisone  40 mg daily for 5 days. Take this in the morning. Start this medication on 01/13/23 Use ankle sleeve when up walking for the next 4 to 5 days.  Then increase activity as tolerated and use the sleeve as needed May use ice for 10 to 15 minutes 2-3 times daily as needed for pain and swelling.  Do not apply the ice directly to the skin. If symptoms fail to improve in the next 4 to 5 days, then recommend following up with orthopedics Return to urgent care or PCP if symptoms worsen or fail to resolve.   Final Clinical Impressions(s) / UC Diagnoses   Final diagnoses:  Acute left ankle pain     Discharge Instructions      Symptoms most consistent with flareup of left ankle arthritis.  Will treat with the following: Decadron  injection given today.  This is a steroid to help with pain and inflammation Prednisone  40 mg daily for 5 days. Take this in the morning. Start this medication on 01/13/23 Use ankle sleeve when up walking for the next 4 to 5 days.  Then increase activity as tolerated and use the sleeve as needed May use ice for 10 to 15 minutes 2-3 times daily as needed for pain and swelling.  Do not apply the ice directly to the skin. If symptoms fail to improve in the next 4 to 5 days, then recommend following up with orthopedics Return to urgent care or PCP if symptoms worsen or fail to resolve.     ED Prescriptions      Medication Sig Dispense Auth. Provider   predniSONE  (DELTASONE ) 10 MG tablet Take 4 tablets (40 mg total) by mouth daily with breakfast for 5 days. 20 tablet Teresa Almarie LABOR, [place]      PDMP not reviewed this encounter.   Teresa Almarie LABOR, PA-C 01/12/23 1452

## 2023-01-25 ENCOUNTER — Other Ambulatory Visit: Payer: Self-pay | Admitting: Family

## 2023-01-25 DIAGNOSIS — Z8739 Personal history of other diseases of the musculoskeletal system and connective tissue: Secondary | ICD-10-CM

## 2023-01-25 DIAGNOSIS — I10 Essential (primary) hypertension: Secondary | ICD-10-CM

## 2023-01-30 ENCOUNTER — Encounter (HOSPITAL_COMMUNITY): Payer: Self-pay

## 2023-01-30 ENCOUNTER — Ambulatory Visit (HOSPITAL_COMMUNITY): Payer: Self-pay

## 2023-01-30 ENCOUNTER — Ambulatory Visit (HOSPITAL_COMMUNITY)
Admission: RE | Admit: 2023-01-30 | Discharge: 2023-01-30 | Disposition: A | Discharge status: Home / Self Care | Payer: Medicare Other | Source: Ambulatory Visit | Attending: Emergency Medicine | Admitting: Emergency Medicine

## 2023-01-30 ENCOUNTER — Ambulatory Visit (INDEPENDENT_AMBULATORY_CARE_PROVIDER_SITE_OTHER): Payer: Medicare Other

## 2023-01-30 VITALS — BP 140/86 | HR 66 | Temp 98.1°F | Resp 18

## 2023-01-30 DIAGNOSIS — M25572 Pain in left ankle and joints of left foot: Secondary | ICD-10-CM

## 2023-01-30 DIAGNOSIS — M19072 Primary osteoarthritis, left ankle and foot: Secondary | ICD-10-CM | POA: Diagnosis not present

## 2023-01-30 DIAGNOSIS — M7989 Other specified soft tissue disorders: Secondary | ICD-10-CM | POA: Diagnosis not present

## 2023-01-30 DIAGNOSIS — M7752 Other enthesopathy of left foot: Secondary | ICD-10-CM | POA: Diagnosis not present

## 2023-01-30 NOTE — ED Provider Notes (Signed)
[facility]    CSN: [id number] Arrival date & time: 01/30/23  1411      History   Chief Complaint Chief Complaint  Patient presents with   Ankle Pain    Hurts when I walk and when I wiggle my foot - Entered by patient    HPI Matthew Foster is a 68 y.o. male.   Patient presents to clinic for continued left ankle pain.  He was seen here a few weeks ago and completed his course of prednisone for suspected gout.  During this his ankle pain improved but then it shortly returned.  The area is not hot or swollen, which is a atypical presentation for gout flareup.  He has not had any injuries, twisting, trauma or falls to the ankle.  Has tried Tylenol without much relief.  Thinks he may have arthritis.  Has been using a cane to ambulate.  Will have pain with rotation of the ankle and occasionally with walking.  The history is provided by the patient and medical records.  Ankle Pain   Past Medical History:  Diagnosis Date   Carpal tunnel syndrome of left wrist    Chest pain    Edema    Gastric ulcer    Gout    HTN (hypertension)    Paronychia     Patient Active Problem List   Diagnosis Date Noted   Hx of gout 12/11/2022   Hyperlipidemia LDL goal <100 05/17/2022   Essential hypertension 10/06/2010   Abnormal ECG 10/06/2010    Past Surgical History:  Procedure Laterality Date   ABDOMINAL SURGERY     gastric ulcer repair         Home Medications    Prior to Admission medications   Medication Sig Start Date End Date Taking? Authorizing Provider  allopurinol (ZYLOPRIM) 100 MG tablet TAKE 2 TABLETS(200 MG) BY MOUTH DAILY 01/25/23   Ngetich, Dinah C, NP  amLODipine (NORVASC) 10 MG tablet Take 1 tablet (10 mg total) by mouth daily. 12/10/22   Ngetich, Dinah C, NP  aspirin 81 MG tablet Take 81 mg by mouth daily.      [provider]  Ibuprofen (ADVIL PO) Take 200 mg by mouth as needed.    [provider]  lisinopril (ZESTRIL) 20 MG tablet TAKE  2 TABLETS(40 MG) BY MOUTH DAILY 01/25/23   Ngetich, Dinah C, NP  metoprolol succinate (TOPROL-XL) 100 MG 24 hr tablet Take 1 tablet (100 mg total) by mouth daily. Take with or immediately following a meal. 12/10/22   Ngetich, Dinah C, NP  rosuvastatin (CRESTOR) 10 MG tablet Take 10 mg by mouth daily.    [provider]    Family History Family History  Problem Relation Age of Onset   Diabetes Mother    Heart attack Mother    Stroke Father    Diabetes Father    Multiple sclerosis Sister    Hypertension Brother    Heart attack Brother    Hypertension Brother    Gout Brother    Hypertension Daughter    COPD Daughter    Gout Son     Social History Social History   Tobacco Use   Smoking status: Never   Smokeless tobacco: Never  Vaping Use   Vaping status: Never Used  Substance Use Topics   Alcohol use: No   Drug use: No     Allergies   Patient has no known allergies.   Review of Systems Review of Systems  Per HPI   Physical Exam Triage Vital Signs ED Triage Vitals  Encounter Vitals Group     BP 01/30/23 1443 (!) 140/86     Systolic BP Percentile --      Diastolic BP Percentile --      Pulse Rate 01/30/23 1443 66     Resp 01/30/23 1443 18     Temp 01/30/23 1443 98.1 F (36.7 C)     Temp Source 01/30/23 1443 Oral     SpO2 01/30/23 1443 94 %     Weight --      Height --      Head Circumference --      Peak Flow --      Pain Score 01/30/23 1444 7     Pain Loc --      Pain Education --      Exclude from Growth Chart --    No data found.  Updated Vital Signs BP (!) 140/86 (BP Location: Right Arm)   Pulse 66   Temp 98.1 F (36.7 C) (Oral)   Resp 18   SpO2 94%   Visual Acuity Right Eye Distance:   Left Eye Distance:   Bilateral Distance:    Right Eye Near:   Left Eye Near:    Bilateral Near:     Physical Exam Vitals and nursing note reviewed.  Constitutional:      Appearance: Normal appearance.  HENT:     Head: Normocephalic and  atraumatic.     Right Ear: External ear normal.     Left Ear: External ear normal.     Nose: Nose normal.     Mouth/Throat:     Mouth: Mucous membranes are moist.  Eyes:     Conjunctiva/sclera: Conjunctivae normal.  Cardiovascular:     Rate and Rhythm: Normal rate.     Pulses: Normal pulses.          Dorsalis pedis pulses are 2+ on the left side.       Posterior tibial pulses are 2+ on the left side.  Pulmonary:     Effort: Pulmonary effort is normal. No respiratory distress.  Musculoskeletal:        General: No swelling or signs of injury. Normal range of motion.     Right lower leg: No edema.     Left lower leg: No edema.       Feet:  Feet:     Left foot:     Skin integrity: Skin integrity normal.     Comments: Pain along anterior ankle. Wearing compression stockings, no swelling, warmth or erythema.  Skin:    General: Skin is warm and dry.     Capillary Refill: Capillary refill takes less than 2 seconds.  Neurological:     General: No focal deficit present.     Mental Status: He is alert and oriented to person, place, and time.  Psychiatric:        Mood and Affect: Mood normal.        Behavior: Behavior normal.      UC Treatments / Results  Labs (all labs ordered are listed, but only abnormal results are displayed) Labs Reviewed - No data to display  EKG   Radiology No results found.  Procedures Procedures (including critical care time)  Medications Ordered in UC Medications - No data to display  Initial Impression / Assessment and Plan / UC Course  I have reviewed the triage vital signs and the nursing notes.  Pertinent  labs & imaging results that were available during my care of the patient were reviewed by me and considered in my medical decision making (see chart for details).  Vitals and triage reviewed, patient is hemodynamically stable. Ankle with pain, no swelling or obvious deformity.  Atraumatic.  Brisk capillary refill and 2+ pedal pulses.   Imaging awaiting official radiology over-read, no obvious fx or deformity. Suggested tylenol use and ortho follow-up.  Will contact if radiology interpretation is different than mine.  Plan of care, follow-up care return precautions given, no questions at this time.     Final Clinical Impressions(s) / UC Diagnoses   Final diagnoses:  Acute left ankle pain     Discharge Instructions      The official read is not back but your x-ray was suspicious for arthritis.  It does not show any obvious fractures or breaks.  I will contact you if they are interpretation is different than mine.  You can take Tylenol arthritis 650 mg every 8 hours for pain.  Please follow-up with orthopedic as this pain has been ongoing and they can offer further interventions.     ED Prescriptions   None    PDMP not reviewed this encounter.   Princetta Uplinger, [place], [place] 01/30/23 [phone]

## 2023-01-30 NOTE — Discharge Instructions (Addendum)
The official read is not back but your x-ray was suspicious for arthritis.  It does not show any obvious fractures or breaks.  I will contact you if they are interpretation is different than mine.  You can take Tylenol arthritis 650 mg every 8 hours for pain.  Please follow-up with orthopedic as this pain has been ongoing and they can offer further interventions.

## 2023-01-30 NOTE — ED Triage Notes (Signed)
Pt c/o lt ankle pain since Friday. States seen here a few weeks ago and complete his course of prednisone with short relief. Denies injury.

## 2023-03-13 ENCOUNTER — Ambulatory Visit (HOSPITAL_COMMUNITY)
Admission: RE | Admit: 2023-03-13 | Discharge: 2023-03-13 | Disposition: A | Discharge status: Home / Self Care | Source: Ambulatory Visit | Attending: Family Medicine | Admitting: Family Medicine

## 2023-03-13 ENCOUNTER — Encounter (HOSPITAL_COMMUNITY): Payer: Self-pay

## 2023-03-13 ENCOUNTER — Ambulatory Visit (INDEPENDENT_AMBULATORY_CARE_PROVIDER_SITE_OTHER)

## 2023-03-13 VITALS — BP 162/99 | HR 70 | Temp 98.4°F | Resp 18

## 2023-03-13 DIAGNOSIS — R0781 Pleurodynia: Secondary | ICD-10-CM | POA: Diagnosis not present

## 2023-03-13 DIAGNOSIS — R109 Unspecified abdominal pain: Secondary | ICD-10-CM

## 2023-03-13 LAB — POCT URINALYSIS DIP (MANUAL ENTRY)
Bilirubin, UA: NEGATIVE
Blood, UA: NEGATIVE
Glucose, UA: NEGATIVE mg/dL
Ketones, POC UA: NEGATIVE mg/dL
Leukocytes, UA: NEGATIVE
Nitrite, UA: NEGATIVE
Protein Ur, POC: NEGATIVE mg/dL
Spec Grav, UA: 1.02 (ref 1.010–1.025)
Urobilinogen, UA: 0.2 U/dL
pH, UA: 5.5 (ref 5.0–8.0)

## 2023-03-13 NOTE — ED Provider Notes (Signed)
 [facility]    CSN: [id number] Arrival date & time: 03/13/23  1249      History   Chief Complaint Chief Complaint  Patient presents with   Flank Pain    Sharp pain on right side for 2 weeks and seems to be getting worse - Entered by patient    HPI Matthew Foster is a 68 y.o. male.    Flank Pain  Patient is here for sharp pains to right side of the abdomen/flank x 2-3 weeks, worsening the last several days.  Pain comes/goes.  He is having mild pain now, feeling it when he breaths.  No n/v. No fevers/chills.  No diarrhea/constipation.  No urinary symptoms that are abnormal for him.  He has not taken anything for pain today.  He has taken advil at times with some help.        Past Medical History:  Diagnosis Date   Carpal tunnel syndrome of left wrist    Chest pain    Edema    Gastric ulcer    Gout    HTN (hypertension)    Paronychia     Patient Active Problem List   Diagnosis Date Noted   Hx of gout 12/11/2022   Hyperlipidemia LDL goal <100 05/17/2022   Essential hypertension 10/06/2010   Abnormal ECG 10/06/2010    Past Surgical History:  Procedure Laterality Date   ABDOMINAL SURGERY     gastric ulcer repair         Home Medications    Prior to Admission medications   Medication Sig Start Date End Date Taking? Authorizing Provider  allopurinol (ZYLOPRIM) 100 MG tablet TAKE 2 TABLETS(200 MG) BY MOUTH DAILY 01/25/23   Ngetich, Dinah C, NP  amLODipine (NORVASC) 10 MG tablet Take 1 tablet (10 mg total) by mouth daily. 12/10/22   Ngetich, Dinah C, NP  aspirin 81 MG tablet Take 81 mg by mouth daily.      [provider]  Ibuprofen (ADVIL PO) Take 200 mg by mouth as needed.    [provider]  lisinopril (ZESTRIL) 20 MG tablet TAKE 2 TABLETS(40 MG) BY MOUTH DAILY 01/25/23   Ngetich, Dinah C, NP  metoprolol succinate (TOPROL-XL) 100 MG 24 hr tablet Take 1 tablet (100 mg total) by mouth daily. Take with or immediately following a  meal. 12/10/22   Ngetich, Dinah C, NP  rosuvastatin (CRESTOR) 10 MG tablet Take 10 mg by mouth daily.    [provider]    Family History Family History  Problem Relation Age of Onset   Diabetes Mother    Heart attack Mother    Stroke Father    Diabetes Father    Multiple sclerosis Sister    Hypertension Brother    Heart attack Brother    Hypertension Brother    Gout Brother    Hypertension Daughter    COPD Daughter    Gout Son     Social History Social History   Tobacco Use   Smoking status: Never   Smokeless tobacco: Never  Vaping Use   Vaping status: Never Used  Substance Use Topics   Alcohol use: No   Drug use: No     Allergies   Patient has no known allergies.   Review of Systems Review of Systems  Constitutional: Negative.   HENT: Negative.    Respiratory: Negative.    Cardiovascular: Negative.   Gastrointestinal: Negative.   Genitourinary:  Positive for flank pain.     Physical  Exam Triage Vital Signs ED Triage Vitals [03/13/23 1342]  Encounter Vitals Group     BP (!) 162/99     Systolic BP Percentile      Diastolic BP Percentile      Pulse Rate 70     Resp 18     Temp 98.4 F (36.9 C)     Temp Source Oral     SpO2 94 %     Weight      Height      Head Circumference      Peak Flow      Pain Score 9     Pain Loc      Pain Education      Exclude from Growth Chart    No data found.  Updated Vital Signs BP (!) 162/99 (BP Location: Left Arm)   Pulse 70   Temp 98.4 F (36.9 C) (Oral)   Resp 18   SpO2 94%   Visual Acuity Right Eye Distance:   Left Eye Distance:   Bilateral Distance:    Right Eye Near:   Left Eye Near:    Bilateral Near:     Physical Exam Constitutional:      Appearance: Normal appearance. He is normal weight.  Cardiovascular:     Rate and Rhythm: Normal rate and regular rhythm.  Pulmonary:     Effort: Pulmonary effort is normal.     Breath sounds: Normal breath sounds.  Abdominal:      Palpations: Abdomen is soft.     Tenderness: There is no abdominal tenderness. There is no guarding or rebound.  Musculoskeletal:     Cervical back: Normal range of motion and neck supple.     Comments: +TTP to the right lateral ribs, slightly posterior;    Neurological:     General: No focal deficit present.     Mental Status: He is alert.  Psychiatric:        Mood and Affect: Mood normal.      UC Treatments / Results  Labs (all labs ordered are listed, but only abnormal results are displayed) Labs Reviewed  POCT URINALYSIS DIP (MANUAL ENTRY)    EKG   Radiology No results found.  Procedures Procedures (including critical care time)  Medications Ordered in UC Medications - No data to display  Initial Impression / Assessment and Plan / UC Course  I have reviewed the triage vital signs and the nursing notes.  Pertinent labs & imaging results that were available during my care of the patient were reviewed by me and considered in my medical decision making (see chart for details).  Final Clinical Impressions(s) / UC Diagnoses   Final diagnoses:  Rib pain on right side  Right flank pain     Discharge Instructions      You were seen today for right rib/flank pain.  Your urine was normal today.  Your xray appears normal, but if the radiologist reads this as something different we will call to notify you.  In the mean time, continue motrin/advil for pain, and use heat and ice.  If this pain continues, then please follow up with your primary care provider.     ED Prescriptions   None    PDMP not reviewed this encounter.   Jannifer Franklin, MD 03/13/23 1419

## 2023-03-13 NOTE — Discharge Instructions (Signed)
 You were seen today for right rib/flank pain.  Your urine was normal today.  Your xray appears normal, but if the radiologist reads this as something different we will call to notify you.  In the mean time, continue motrin/advil for pain, and use heat and ice.  If this pain continues, then please follow up with your primary care provider.

## 2023-03-13 NOTE — ED Triage Notes (Signed)
 Pt c/o rt flank tenderness and pain x2wks and now constant x3 days. States took advil with no relief.

## 2023-04-30 DIAGNOSIS — D3131 Benign neoplasm of right choroid: Secondary | ICD-10-CM | POA: Diagnosis not present

## 2023-04-30 DIAGNOSIS — H04123 Dry eye syndrome of bilateral lacrimal glands: Secondary | ICD-10-CM | POA: Diagnosis not present

## 2023-04-30 DIAGNOSIS — H40033 Anatomical narrow angle, bilateral: Secondary | ICD-10-CM | POA: Diagnosis not present

## 2023-04-30 DIAGNOSIS — H25813 Combined forms of age-related cataract, bilateral: Secondary | ICD-10-CM | POA: Diagnosis not present

## 2023-04-30 DIAGNOSIS — H35362 Drusen (degenerative) of macula, left eye: Secondary | ICD-10-CM | POA: Diagnosis not present

## 2023-06-12 ENCOUNTER — Encounter: Payer: Self-pay | Admitting: Family

## 2023-06-12 ENCOUNTER — Ambulatory Visit (INDEPENDENT_AMBULATORY_CARE_PROVIDER_SITE_OTHER): Admitting: Family

## 2023-06-12 ENCOUNTER — Other Ambulatory Visit

## 2023-06-12 VITALS — BP 136/76 | Temp 97.5°F | Ht 75.0 in | Wt 279.0 lb

## 2023-06-12 DIAGNOSIS — Z6834 Body mass index (BMI) 34.0-34.9, adult: Secondary | ICD-10-CM

## 2023-06-12 DIAGNOSIS — E785 Hyperlipidemia, unspecified: Secondary | ICD-10-CM | POA: Diagnosis not present

## 2023-06-12 DIAGNOSIS — T753XXD Motion sickness, subsequent encounter: Secondary | ICD-10-CM | POA: Diagnosis not present

## 2023-06-12 DIAGNOSIS — E6609 Other obesity due to excess calories: Secondary | ICD-10-CM | POA: Insufficient documentation

## 2023-06-12 DIAGNOSIS — I10 Essential (primary) hypertension: Secondary | ICD-10-CM | POA: Diagnosis not present

## 2023-06-12 DIAGNOSIS — Z8739 Personal history of other diseases of the musculoskeletal system and connective tissue: Secondary | ICD-10-CM

## 2023-06-12 DIAGNOSIS — E66811 Obesity, class 1: Secondary | ICD-10-CM

## 2023-06-12 DIAGNOSIS — Z125 Encounter for screening for malignant neoplasm of prostate: Secondary | ICD-10-CM

## 2023-06-12 MED ORDER — MECLIZINE HCL 12.5 MG PO TABS: 12.5000 mg | ORAL_TABLET | Freq: Three times a day (TID) | ORAL | 3 refills | Status: DC | PRN

## 2023-06-12 MED ORDER — ZEPBOUND 2.5 MG/0.5ML ~~LOC~~ SOAJ: 2.5000 mg | SUBCUTANEOUS | 3 refills | Status: DC

## 2023-06-12 MED ORDER — LISINOPRIL 40 MG PO TABS: 40.0000 mg | ORAL_TABLET | Freq: Every day | ORAL | 1 refills | Status: DC

## 2023-06-12 NOTE — Progress Notes (Signed)
 Provider: Christean Courts FNP-C   Lyell Clugston, Elijio Guadeloupe, NP  Patient Care Team: Akiem Urieta, Elijio Guadeloupe, NP as PCP - General (Family Medicine)  Extended Emergency Contact Information Primary Emergency Contact: Appleton,Brittany Mobile Phone: [phone] Relation: Daughter Secondary Emergency Contact: Rambeau,Jarred  [place] Mobile Phone: [phone] Relation: Son  Code Status:  Full Code  Goals of care: Advanced Directive information    06/12/2023    3:01 PM  Advanced Directives  Does Patient Have a Medical Advance Directive? No  Would patient like information on creating a medical advance directive? No - Patient declined     Chief Complaint  Patient presents with   Medical Management of Chronic Issues    6 Month follow up and check uric acid level.    Discussed the use of AI scribe software for clinical note transcription with the patient, who gave verbal consent to proceed.  History of Present Illness   Matthew Foster is a 68 year old male with a history of gout, hypertension, and hyperlipidemia who presents for a six-month follow-up visit.  He has been doing okay with no significant changes in his health status. He is trying to lose weight and has started exercising by using a bicycle four days a week for about 30 minutes each session. His left ankle hurts, which limits his ability to walk much, but he is making an effort to stay active. He has not made any changes to his diet and continues to prepare his own meals at home, avoiding fried foods due to his history of gout.  He reports no recent flare-ups of gout and has stopped taking allopurinol  for several months as he has not experienced any recent flare-ups. He still has some allopurinol  left but has chosen not to take it, instead managing his condition by avoiding foods that trigger gout. He continues to take aspirin, rosuvastatin 10 mg, ibuprofen 200 mg as needed, amlodipine  10 mg daily, and lisinopril  40 mg daily  (taking two 20 mg pills). He has not had any issues with his current medications.  His last lab work was done six months ago. At that time, his HDL cholesterol was low, and his LDL cholesterol was high at 105 mg/dL, up from 77 mg/dL previously. His total cholesterol was 163 mg/dL, which is within the desired range of below 200. He has not had a recent COVID vaccine since December 2023 and has not yet received the shingles vaccine.  He works night shifts during the week, starting on Sunday night, and typically gets up once at night to urinate. He reports sleeping well, usually waking up once to use the bathroom and then returning to sleep for a couple more hours. No issues with anxiety or depression. He does not consume fried foods due to his history of gout and drinks a lot of water, avoiding sugary drinks. He has not seen a urologist and has no family history of thyroid  cancer.  He inquired about medication for motion sickness as he plans to go on a seven-day trip at the end of the month. He has experienced motion sickness before and is seeking medication to manage it.   Past Medical History:  Diagnosis Date   Carpal tunnel syndrome of left wrist    Chest pain    Edema    Gastric ulcer    Gout    HTN (hypertension)    Paronychia    Past Surgical History:  Procedure Laterality Date   ABDOMINAL SURGERY     gastric  ulcer repair      No Known Allergies  Allergies as of 06/12/2023   No Known Allergies      Medication List        Accurate as of June 12, 2023 10:54 PM. If you have any questions, ask your nurse or doctor.          ADVIL PO Take 200 mg by mouth as needed.   allopurinol  100 MG tablet Commonly known as: ZYLOPRIM  TAKE 2 TABLETS(200 MG) BY MOUTH DAILY   amLODipine  10 MG tablet Commonly known as: NORVASC  Take 1 tablet (10 mg total) by mouth daily.   aspirin 81 MG tablet Take 81 mg by mouth daily.   lisinopril  40 MG tablet Commonly known as: ZESTRIL  Take 1  tablet (40 mg total) by mouth daily. What changed:  medication strength See the new instructions. Changed by: Larin Depaoli C Macarius Ruark   meclizine 12.5 MG tablet Commonly known as: ANTIVERT Take 1 tablet (12.5 mg total) by mouth 3 (three) times daily as needed for dizziness. Started by: Chibuike Fleek C Lorilyn Laitinen   metoprolol  succinate 100 MG 24 hr tablet Commonly known as: TOPROL -XL Take 1 tablet (100 mg total) by mouth daily. Take with or immediately following a meal.   rosuvastatin 10 MG tablet Commonly known as: CRESTOR Take 10 mg by mouth daily.   Zepbound 2.5 MG/0.5ML Pen Generic drug: tirzepatide Inject 2.5 mg into the skin once a week. Started by: Elijio Guadeloupe Verena Shawgo        Review of Systems  Constitutional:  Negative for appetite change, chills, fatigue, fever and unexpected weight change.  HENT:  Negative for congestion, dental problem, ear discharge, ear pain, facial swelling, hearing loss, nosebleeds, postnasal drip, rhinorrhea, sinus pressure, sinus pain, sneezing, sore throat, tinnitus and trouble swallowing.   Eyes:  Negative for pain, discharge, redness, itching and visual disturbance.  Respiratory:  Negative for cough, chest tightness, shortness of breath and wheezing.   Cardiovascular:  Negative for chest pain, palpitations and leg swelling.  Gastrointestinal:  Negative for abdominal distention, abdominal pain, blood in stool, constipation, diarrhea, nausea and vomiting.  Endocrine: Negative for cold intolerance, heat intolerance, polydipsia, polyphagia and polyuria.  Genitourinary:  Negative for difficulty urinating, dysuria, flank pain, frequency and urgency.  Musculoskeletal:  Negative for arthralgias, back pain, gait problem, joint swelling, myalgias, neck pain and neck stiffness.  Skin:  Negative for color change, pallor, rash and wound.  Neurological:  Negative for dizziness, syncope, speech difficulty, weakness, light-headedness, numbness and headaches.       Motion sickness  with cruise   Hematological:  Does not bruise/bleed easily.  Psychiatric/Behavioral:  Negative for agitation, behavioral problems, confusion, hallucinations, self-injury, sleep disturbance and suicidal ideas. The patient is not nervous/anxious.     Immunization History  Administered Date(s) Administered   Fluad Trivalent(High Dose 65+) 12/10/2022   PFIZER(Purple Top)SARS-COV-2 Vaccination 04/18/2019, 10/25/2019, 05/11/2020   PNEUMOCOCCAL CONJUGATE-20 12/10/2022   Pfizer Covid-19 Vaccine Bivalent Booster 72yrs & up 03/04/2021, 12/30/2021   Tdap 01/07/2017   Zoster Recombinant(Shingrix) 04/01/2021   Pertinent  Health Maintenance Due  Topic Date Due   INFLUENZA VACCINE  08/02/2023   Colonoscopy  03/31/2025      01/07/2022    2:11 PM 05/16/2022    8:29 AM 12/26/2022    2:33 PM 12/31/2022    8:44 AM 06/12/2023    3:06 PM  Fall Risk  Falls in the past year?  0 0 0 0  Was there an injury with Fall?  0  0 0 0  Fall Risk Category Calculator  0 0  0 0  (RETIRED) Patient Fall Risk Level Low fall risk      Patient at Risk for Falls Due to  No Fall Risks  No Fall Risks No Fall Risks  Fall risk Follow up  Falls evaluation completed  Falls evaluation completed Falls evaluation completed     Patient-reported   Functional Status Survey:    Vitals:   06/12/23 1507  BP: 136/76  Temp: (!) 97.5 F (36.4 C)  SpO2: 95%  Weight: 279 lb (126.6 kg)  Height: 6' 3 (1.905 m)   Body mass index is 34.87 kg/m. Physical Exam VITALS: T- 97.5, P- 54, BP- 136/76, SaO2- 95% MEASUREMENTS: Weight- 279, BMI- 34.8. GENERAL: Alert, cooperative, well developed, no acute distress. HEENT: Normocephalic, normal oropharynx, moist mucous membranes. Right ear with cerumen, left ear clear, tympanic membranes normal. Nose normal. Eyes normal. No sinus tenderness. CHEST: Clear to auscultation bilaterally. No wheezes, rhonchi, or crackles. No chest wall tenderness. CARDIOVASCULAR: Normal heart rate and rhythm, S1 and  S2 normal without murmurs. ABDOMEN: Soft, non-tender, non-distended, without organomegaly. Normal bowel sounds. EXTREMITIES: No cyanosis or edema. MUSCULOSKELETAL: Hips with normal range of motion, non-tender. NEUROLOGICAL: Cranial nerves grossly intact, moves all extremities without gross motor or sensory deficit.  SKIN: No rash,no lesion or erythema   PSYCHIATRY/BEHAVIORAL: Mood stable    Labs reviewed: Recent Labs    12/10/22 0930  NA 140  K 5.0  CL 106  CO2 26  GLUCOSE 104*  BUN 21  CREATININE 0.99  CALCIUM 9.5   Recent Labs    12/10/22 0930  AST 18  ALT 21  BILITOT 0.2  PROT 6.7   Recent Labs    12/10/22 0930  WBC 7.1  NEUTROABS 4,438  HGB 14.5  HCT 46.4  MCV 82.9  PLT 372   Lab Results  Component Value Date   TSH 2.22 05/16/2022   No results found for: HGBA1C Lab Results  Component Value Date   CHOL 163 12/10/2022   HDL 39 (L) 12/10/2022   LDLCALC 105 (H) 12/10/2022   TRIG 92 12/10/2022   CHOLHDL 4.2 12/10/2022    Significant Diagnostic Results in last 30 days:  No results found.  Assessment/Plan  Hypertension Hypertension is well-managed with amlodipine , metoprolol , and lisinopril . Blood pressure is 136/76 mmHg. Lisinopril  prescription adjusted to a single 40 mg tablet for convenience. - Continue current antihypertensive regimen - Adjust lisinopril  prescription to 40 mg tablet  Hyperlipidemia Hyperlipidemia managed with rosuvastatin 10 mg daily. Previous LDL cholesterol was 105 mg/dL. Medication adherence and lifestyle modifications emphasized. - Continue rosuvastatin 10 mg daily - Order fasting lipid panel to reassess cholesterol levels  Obesity BMI is 34.8. Actively pursuing weight loss through exercise and dietary changes. Interested in Zepbound for weight loss. Dosing regimen and potential side effects, including nausea and vomiting, explained. Importance of lifestyle changes alongside medication emphasized. Medication is a  once-weekly injection. Insurance coverage discussed. - Prescribe Zepbound 2.5 mg weekly for weight loss - Encourage continued exercise and dietary modifications  Gout Gout is well-controlled with no recent flare-ups. Allopurinol  discontinued as no flare-ups have occurred. Dietary management to avoid trigger foods is ongoing. Role of allopurinol  in preventing gout attacks discussed and kept on medication list as a precaution. - Continue dietary management to avoid gout triggers - Keep allopurinol  on medication list for potential future use  Motion Sickness Experiences motion sickness and is planning a cruise. Meclizine prescribed for symptom  management during the trip, to be taken up to three times daily as needed. - Prescribe meclizine for motion sickness, to be taken as needed up to three times daily  General Health Maintenance Due for shingles vaccine and COVID-19 booster. Immunizations otherwise up to date. Importance of maintaining vaccinations emphasized. - Advise to receive shingles vaccine at pharmacy - Advise to receive COVID-19 booster at pharmacy  Follow-up Follow-up for fasting blood work and six-month appointment scheduled. Importance of fasting for accurate blood work results emphasized. - Schedule fasting blood work for tomorrow evening - Schedule six-month follow-up appointment   Family/ staff Communication: Reviewed plan of care with patient verbalized understanding   Labs/tests ordered:  - CBC with Differential/Platelet - CMP with eGFR([facility]) - TSH - PSA level   Next Appointment : Return in about 6 months (around 12/12/2023) for medical mangement of chronic issues., Fasting labs tomorrow.   Spent 30 minutes of Face to face and non-face to face with patient  >50% time spent counseling; reviewing medical record; tests; labs; documentation and developing future plan of care.   Estil Heman, NP

## 2023-06-13 ENCOUNTER — Other Ambulatory Visit

## 2023-06-13 DIAGNOSIS — I10 Essential (primary) hypertension: Secondary | ICD-10-CM

## 2023-06-13 DIAGNOSIS — E785 Hyperlipidemia, unspecified: Secondary | ICD-10-CM | POA: Diagnosis not present

## 2023-06-13 DIAGNOSIS — Z125 Encounter for screening for malignant neoplasm of prostate: Secondary | ICD-10-CM

## 2023-06-14 ENCOUNTER — Ambulatory Visit: Payer: PPO | Admitting: Family

## 2023-06-19 ENCOUNTER — Ambulatory Visit: Payer: Self-pay | Admitting: Family

## 2023-06-20 ENCOUNTER — Other Ambulatory Visit: Payer: Self-pay | Admitting: Family

## 2023-06-20 DIAGNOSIS — I10 Essential (primary) hypertension: Secondary | ICD-10-CM

## 2023-09-02 ENCOUNTER — Other Ambulatory Visit: Payer: Self-pay | Admitting: Family

## 2023-09-02 DIAGNOSIS — Z8739 Personal history of other diseases of the musculoskeletal system and connective tissue: Secondary | ICD-10-CM

## 2023-09-09 ENCOUNTER — Other Ambulatory Visit: Payer: Self-pay | Admitting: Family

## 2023-09-09 DIAGNOSIS — I10 Essential (primary) hypertension: Secondary | ICD-10-CM

## 2023-09-13 ENCOUNTER — Encounter (HOSPITAL_COMMUNITY): Payer: Self-pay

## 2023-09-13 ENCOUNTER — Other Ambulatory Visit: Payer: Self-pay

## 2023-09-13 ENCOUNTER — Emergency Department (HOSPITAL_COMMUNITY)
Admission: EM | Admit: 2023-09-13 | Discharge: 2023-09-14 | Disposition: A | Discharge status: Home / Self Care | Attending: Emergency Medicine | Admitting: Emergency Medicine

## 2023-09-13 ENCOUNTER — Emergency Department (HOSPITAL_COMMUNITY)

## 2023-09-13 DIAGNOSIS — Z79899 Other long term (current) drug therapy: Secondary | ICD-10-CM | POA: Insufficient documentation

## 2023-09-13 DIAGNOSIS — J40 Bronchitis, not specified as acute or chronic: Secondary | ICD-10-CM | POA: Diagnosis not present

## 2023-09-13 DIAGNOSIS — Z7982 Long term (current) use of aspirin: Secondary | ICD-10-CM | POA: Insufficient documentation

## 2023-09-13 DIAGNOSIS — R079 Chest pain, unspecified: Secondary | ICD-10-CM | POA: Diagnosis not present

## 2023-09-13 DIAGNOSIS — I10 Essential (primary) hypertension: Secondary | ICD-10-CM | POA: Insufficient documentation

## 2023-09-13 DIAGNOSIS — R918 Other nonspecific abnormal finding of lung field: Secondary | ICD-10-CM | POA: Diagnosis not present

## 2023-09-13 DIAGNOSIS — J9811 Atelectasis: Secondary | ICD-10-CM | POA: Diagnosis not present

## 2023-09-13 DIAGNOSIS — R9389 Abnormal findings on diagnostic imaging of other specified body structures: Secondary | ICD-10-CM | POA: Diagnosis not present

## 2023-09-13 DIAGNOSIS — R0789 Other chest pain: Secondary | ICD-10-CM | POA: Insufficient documentation

## 2023-09-13 DIAGNOSIS — I2510 Atherosclerotic heart disease of native coronary artery without angina pectoris: Secondary | ICD-10-CM | POA: Diagnosis not present

## 2023-09-13 LAB — CBC
HCT: 44.8 % (ref 39.0–52.0)
Hemoglobin: 14.1 g/dL (ref 13.0–17.0)
MCH: 27.1 pg (ref 26.0–34.0)
MCHC: 31.5 g/dL (ref 30.0–36.0)
MCV: 86 fL (ref 80.0–100.0)
Platelets: 323 K/uL (ref 150–400)
RBC: 5.21 MIL/uL (ref 4.22–5.81)
RDW: 13.3 % (ref 11.5–15.5)
WBC: 6.5 K/uL (ref 4.0–10.5)
nRBC: 0 % (ref 0.0–0.2)

## 2023-09-13 LAB — I-STAT CHEM 8, ED
BUN: 17 mg/dL (ref 8–23)
Calcium, Ion: 1.23 mmol/L (ref 1.15–1.40)
Chloride: 103 mmol/L (ref 98–111)
Creatinine, Ser: 1.1 mg/dL (ref 0.61–1.24)
Glucose, Bld: 64 mg/dL — ABNORMAL LOW (ref 70–99)
HCT: 44 % (ref 39.0–52.0)
Hemoglobin: 15 g/dL (ref 13.0–17.0)
Potassium: 4.2 mmol/L (ref 3.5–5.1)
Sodium: 143 mmol/L (ref 135–145)
TCO2: 25 mmol/L (ref 22–32)

## 2023-09-13 LAB — BASIC METABOLIC PANEL WITH GFR
Anion gap: 11 (ref 5–15)
BUN: 15 mg/dL (ref 8–23)
CO2: 25 mmol/L (ref 22–32)
Calcium: 8.7 mg/dL — ABNORMAL LOW (ref 8.9–10.3)
Chloride: 103 mmol/L (ref 98–111)
Creatinine, Ser: 1.08 mg/dL (ref 0.61–1.24)
GFR, Estimated: >60 mL/min (ref >60)
Glucose, Bld: 59 mg/dL — ABNORMAL LOW (ref 70–99)
Potassium: 4.2 mmol/L (ref 3.5–5.1)
Sodium: 139 mmol/L (ref 135–145)

## 2023-09-13 LAB — TROPONIN I (HIGH SENSITIVITY)
Troponin I (High Sensitivity): 2 ng/L (ref <18)
Troponin I (High Sensitivity): 3 ng/L (ref <18)

## 2023-09-13 NOTE — ED Triage Notes (Signed)
 Pt here for CP that started last night  and SHOB. Denies n/v.

## 2023-09-13 NOTE — ED Provider Triage Note (Signed)
 Emergency Medicine Provider Triage Evaluation Note  Matthew Foster , a 68 y.o. male  was evaluated in triage.  Pt complains of chest pain just under the left pec since last night as well as early this morning.  Has been intermittent in nature.  Feels stabbing in nature.  Nonradiating.  Denies any shortness of breath, leg swelling, recent cough or cold symptoms.  Reports he has a history of irregular heart rhythm but is not on any blood thinners.  Sees cardiology for this issue.  He denies any fevers, belly pain, nausea, vomiting, lightheadedness, diaphoresis.  Has some pleurisy to his pain  Review of Systems  Positive:  Negative:   Physical Exam  BP (!) 171/85 (BP Location: Right Arm)   Pulse 83   Temp 98.2 F (36.8 C)   Resp 17   Ht 6' 3 (1.905 m)   Wt 124.7 kg   SpO2 97%   BMI 34.37 kg/m  Gen:   Awake, no distress   Resp:  Normal effort  MSK:   Moves extremities without difficulty  Other:  Chest nontender to palpation.  Regular rate and rhythm.  Palpable radial pulses that are symmetric.  Lungs are clear to auscultation.  Medical Decision Making  Medically screening exam initiated at 8:08 PM.  Appropriate orders placed.  Matthew Foster was informed that the remainder of the evaluation will be completed by another provider, this initial triage assessment does not replace that evaluation, and the importance of remaining in the ED until their evaluation is complete.  Patient does have some elevated blood pressure.  He does not appear to be acute distress.  Cardiac workup initiated.   Bernis Ernst, PA-C 09/13/23 2009

## 2023-09-14 ENCOUNTER — Emergency Department (HOSPITAL_COMMUNITY)

## 2023-09-14 DIAGNOSIS — R918 Other nonspecific abnormal finding of lung field: Secondary | ICD-10-CM | POA: Diagnosis not present

## 2023-09-14 DIAGNOSIS — R079 Chest pain, unspecified: Secondary | ICD-10-CM | POA: Diagnosis not present

## 2023-09-14 DIAGNOSIS — I2510 Atherosclerotic heart disease of native coronary artery without angina pectoris: Secondary | ICD-10-CM | POA: Diagnosis not present

## 2023-09-14 LAB — D-DIMER, QUANTITATIVE: D-Dimer, Quant: 1.67 ug{FEU}/mL — ABNORMAL HIGH (ref 0.00–0.50)

## 2023-09-14 MED ORDER — IBUPROFEN 400 MG PO TABS
400.0000 mg | ORAL_TABLET | Freq: Once | ORAL | Status: AC
  Administered 2023-09-14: 400 mg via ORAL
  Filled 2023-09-14: qty 1

## 2023-09-14 MED ORDER — IOHEXOL 350 MG/ML SOLN
90.0000 mL | Freq: Once | INTRAVENOUS | Status: AC | PRN
  Administered 2023-09-14: 90 mL via INTRAVENOUS

## 2023-09-14 NOTE — ED Provider Notes (Signed)
 [place] EMERGENCY DEPARTMENT AT [facility] Provider Note   CSN: [id number] Arrival date & time: 09/13/23  1840     Patient presents with: Chest Pain   Matthew Foster is a 68 y.o. male.   HPI     This is a 68 year old male with a history of hypertension who presents with left-sided chest pain.  Patient reports 48-hour history of sharp intermittent left-sided chest.  Worse with moving and breathing.  Denies heavy lifting or injury.  Denies shortness of breath, leg swelling, history of blood clots.  No recent cough or fevers.  Prior to Admission medications   Medication Sig Start Date End Date Taking? Authorizing Provider  allopurinol  (ZYLOPRIM ) 100 MG tablet TAKE 2 TABLETS(200 MG) BY MOUTH DAILY 09/03/23   Ngetich, Dinah C, NP  amLODipine  (NORVASC ) 10 MG tablet TAKE 1 TABLET(10 MG) BY MOUTH DAILY 09/09/23   Ngetich, Dinah C, NP  aspirin 81 MG tablet Take 81 mg by mouth daily.      [provider]  Ibuprofen  (ADVIL  PO) Take 200 mg by mouth as needed.    [provider]  lisinopril  (ZESTRIL ) 40 MG tablet Take 1 tablet (40 mg total) by mouth daily. 06/12/23   Ngetich, Dinah C, NP  meclizine  (ANTIVERT ) 12.5 MG tablet Take 1 tablet (12.5 mg total) by mouth 3 (three) times daily as needed for dizziness. 06/12/23   Ngetich, Dinah C, NP  metoprolol  succinate (TOPROL -XL) 100 MG 24 hr tablet TAKE 1 TABLET(100 MG) BY MOUTH DAILY WITH OR IMMEDIATELY FOLLOWING A MEAL 06/20/23   Ngetich, Dinah C, NP  rosuvastatin (CRESTOR) 10 MG tablet Take 10 mg by mouth daily.    [provider]  tirzepatide  (ZEPBOUND ) 2.5 MG/0.5ML Pen Inject 2.5 mg into the skin once a week. 06/12/23   Ngetich, Dinah C, NP    Allergies: Patient has no known allergies.    Review of Systems  Constitutional:  Negative for fever.  Respiratory:  Negative for shortness of breath.   Cardiovascular:  Positive for chest pain. Negative for leg swelling.  All other systems reviewed and are  negative.   Updated Vital Signs BP (!) 136/97   Pulse (!) 53   Temp 98 F (36.7 C) (Oral)   Resp 13   Ht 1.905 m (6' 3)   Wt 124.7 kg   SpO2 99%   BMI 34.37 kg/m   Physical Exam Vitals and nursing note reviewed.  Constitutional:      Appearance: He is well-developed.  HENT:     Head: Normocephalic and atraumatic.  Eyes:     Pupils: Pupils are equal, round, and reactive to light.  Cardiovascular:     Rate and Rhythm: Normal rate and regular rhythm.     Heart sounds: Normal heart sounds. No murmur heard. Pulmonary:     Effort: Pulmonary effort is normal. No respiratory distress.     Breath sounds: Normal breath sounds. No wheezing.  Chest:     Chest wall: No tenderness.  Abdominal:     General: Bowel sounds are normal.     Palpations: Abdomen is soft.     Tenderness: There is no abdominal tenderness. There is no rebound.  Musculoskeletal:     Cervical back: Neck supple.     Right lower leg: No edema.     Left lower leg: No edema.  Lymphadenopathy:     Cervical: No cervical adenopathy.  Skin:    General: Skin is warm and dry.  Neurological:  Mental Status: He is alert and oriented to person, place, and time.  Psychiatric:        Mood and Affect: Mood normal.     (all labs ordered are listed, but only abnormal results are displayed) Labs Reviewed  BASIC METABOLIC PANEL WITH GFR - Abnormal; Notable for the following components:      Result Value   Glucose, Bld 59 (*)    Calcium 8.7 (*)    All other components within normal limits  D-DIMER, QUANTITATIVE - Abnormal; Notable for the following components:   D-Dimer, Quant 1.67 (*)    All other components within normal limits  I-STAT CHEM 8, ED - Abnormal; Notable for the following components:   Glucose, Bld 64 (*)    All other components within normal limits  CBC  TROPONIN I (HIGH SENSITIVITY)  TROPONIN I (HIGH SENSITIVITY)    EKG: EKG Interpretation Date/Time:  Friday September 13 2023 20:11:33  EDT Ventricular Rate:  72 PR Interval:  174 QRS Duration:  84 QT Interval:  376 QTC Calculation: 411 R Axis:   -13  Text Interpretation: Normal sinus rhythm When compared with ECG of 13-Sep-2023 18:54, PREVIOUS ECG IS PRESENT Confirmed by Bari Pfeiffer ([id number]) on 09/14/2023 3:06:58 AM  Radiology: CT Angio Chest PE W and/or Wo Contrast Result Date: 09/14/2023 CLINICAL DATA:  68 year old male with left side chest pain for 2 days. EXAM: CT ANGIOGRAPHY CHEST WITH CONTRAST TECHNIQUE: Multidetector CT imaging of the chest was performed using the standard protocol during bolus administration of intravenous contrast. Multiplanar CT image reconstructions and MIPs were obtained to evaluate the vascular anatomy. RADIATION DOSE REDUCTION: This exam was performed according to the departmental dose-optimization program which includes automated exposure control, adjustment of the mA and/or kV according to patient size and/or use of iterative reconstruction technique. CONTRAST:  90mL OMNIPAQUE  IOHEXOL  350 MG/ML SOLN COMPARISON:  Cardiac CT 11/01/2010.  Chest radiographs yesterday. FINDINGS: Cardiovascular: Adequate contrast bolus timing in the pulmonary arterial tree. No pulmonary artery filling defect identified. Some coronary artery calcified atherosclerosis is visible (series 6, image 240). Heart size is normal. No pericardial effusion. Mild superimposed Calcified aortic atherosclerosis. Mediastinum/Nodes: Negative. No mediastinal mass or lymphadenopathy. Lungs/Pleura: Major airways are patent. Chronic elevation of the left hemidiaphragm, present in 2012. Asymmetric lung base atelectasis greater on the left. No pleural effusion. No consolidation or convincing lung inflammation. Trace thickening along the left major fissure on series 7, image 54 appears benign, probably a small intrapleural lymph node (no follow-up imaging recommended). Subcentimeter right lung nodule on series 7, image 69 is unchanged since 2012  and benign (no follow-up imaging recommended). Likewise, small right middle lobe nodule unchanged and benign on image 81 (no follow-up imaging recommended). Upper Abdomen: Numerous surgical clips at the gastroesophageal junction, gastric patent ligament. Decompressed distal esophagus and visible stomach. Negative early postcontrast appearance of the visible liver, gallbladder, spleen, pancreas, adrenal glands, kidneys. Negative other visible bowel in the upper abdomen. Musculoskeletal: Bulky cervical spine flowing anterior osteophytosis and posterior Ossification of the posterior longitudinal ligament (OPLL). With interbody ankylosis to the visible C7 level. Intermittent less pronounced thoracic spine hyperostosis. Bulky posterior thoracic disc osteophyte complex, especially T3-T4 through T8-7 T8. Subsequent thoracic spinal stenosis maximal at T3-T4. No acute osseous abnormality identified. Review of the MIP images confirms the above findings. IMPRESSION: 1. Negative for acute pulmonary embolus. 2. Chronic elevation of the left hemidiaphragm with atelectasis. Incidental small, stable since 2012, benign lung nodules (no follow-up imaging indicated). 3. Partially visible advanced  cervical spine hyperostosis, Ossification of the posterior longitudinal ligament (OPLL) and interbody ankylosis. Bulky thoracic disc and endplate degeneration with subsequent moderate to severe thoracic spinal stenosis at T3-T4. 4. Calcified coronary artery, Aortic Atherosclerosis (ICD10-I70.0). Electronically Signed   By: VEAR Hurst M.D.   On: 09/14/2023 06:12   DG Chest 2 View Result Date: 09/13/2023 CLINICAL DATA:  Chest pain EXAM: CHEST - 2 VIEW COMPARISON:  03/13/2023 FINDINGS: Mild bronchitic changes. Similar elevation left diaphragm. No focal opacity, pleural effusion or pneumothorax. IMPRESSION: No active cardiopulmonary disease. Mild bronchitic changes. Electronically Signed   By: Luke Bun M.D.   On: 09/13/2023 21:41      Procedures   Medications Ordered in the ED  ibuprofen  (ADVIL ) tablet 400 mg (400 mg Oral Given 09/14/23 0325)  iohexol  (OMNIPAQUE ) 350 MG/ML injection 90 mL (90 mLs Intravenous Contrast Given 09/14/23 0554)                                    Medical Decision Making Amount and/or Complexity of Data Reviewed Labs: ordered. Radiology: ordered.  Risk Prescription drug management.   This patient presents to the ED for concern of chest pain, this involves an extensive number of treatment options, and is a complaint that carries with it a high risk of complications and morbidity.  I considered the following differential and admission for this acute, potentially life threatening condition.  The differential diagnosis includes ACS, PE, pneumothorax, pneumonia, chest wall pain  MDM:    This is a 68 year old who presents with chest pain.  Atypical in nature.  Worse with both breathing and movement.  He does have a history of hypertension.  Reports that he is post to see cardiology on Monday.  EKG shows no evidence of acute ischemia or arrhythmia.  Chest x-ray without pneumothorax or pneumonia.  Initial troponins are reassuring.  D-dimer was obtained and was slightly elevated.  CT PE study obtained and shows no evidence of blood clot.  Overall reassuring workup.  He has cardiology follow-up on Monday.  Given return precautions  (Labs, imaging, consults)  Labs: I Ordered, and personally interpreted labs.  The pertinent results include: CBC, BMP, troponin, D-dimer  Imaging Studies ordered: I ordered imaging studies including chest x-ray, CT I independently visualized and interpreted imaging. I agree with the radiologist interpretation  Additional history obtained from chart review.  External records from outside source obtained and reviewed including evaluations  Cardiac Monitoring: The patient was maintained on a cardiac monitor.  If on the cardiac monitor, I personally viewed and  interpreted the cardiac monitored which showed an underlying rhythm of: Sinus Reevaluation: After the interventions noted above, I reevaluated the patient and found that they have : Improved  Social Determinants of Health:  lives independently  Disposition: Discharge  Co morbidities that complicate the patient evaluation  Past Medical History:  Diagnosis Date   Carpal tunnel syndrome of left wrist    Chest pain    Edema    Gastric ulcer    Gout    HTN (hypertension)    Paronychia      Medicines Meds ordered this encounter  Medications   ibuprofen  (ADVIL ) tablet 400 mg   iohexol  (OMNIPAQUE ) 350 MG/ML injection 90 mL    I have reviewed the patients home medicines and have made adjustments as needed  Problem List / ED Course: Problem List Items Addressed This Visit   None Visit  Diagnoses       Atypical chest pain    -  Primary                Final diagnoses:  Atypical chest pain    ED Discharge Orders     None          Ruhee Enck, Charmaine FALCON, MD 09/14/23 [phone]

## 2023-09-14 NOTE — ED Notes (Signed)
 Patient transported to CT

## 2023-09-14 NOTE — Discharge Instructions (Addendum)
 You were seen today for chest pain.  Your workup is reassuring.  Follow-up with cardiology as scheduled on Monday.  Return for any new or worsening symptoms.

## 2023-09-14 NOTE — ED Notes (Signed)
 Patient returned from CT

## 2023-10-18 DIAGNOSIS — R079 Chest pain, unspecified: Secondary | ICD-10-CM | POA: Diagnosis not present

## 2023-10-18 DIAGNOSIS — R9431 Abnormal electrocardiogram [ECG] [EKG]: Secondary | ICD-10-CM | POA: Diagnosis not present

## 2023-10-18 DIAGNOSIS — E785 Hyperlipidemia, unspecified: Secondary | ICD-10-CM | POA: Diagnosis not present

## 2023-10-18 DIAGNOSIS — I10 Essential (primary) hypertension: Secondary | ICD-10-CM | POA: Diagnosis not present

## 2023-11-11 LAB — COMPLETE METABOLIC PANEL WITHOUT GFR
AG Ratio: 1.5 (calc) (ref 1.0–2.5)
ALT: 21 U/L (ref 9–46)
AST: 17 U/L (ref 10–35)
Albumin: 4 g/dL (ref 3.6–5.1)
Alkaline phosphatase (APISO): 90 U/L (ref 35–144)
BUN: 22 mg/dL (ref 7–25)
CO2: 26 mmol/L (ref 20–32)
Calcium: 9.3 mg/dL (ref 8.6–10.3)
Chloride: 105 mmol/L (ref 98–110)
Creat: 1.15 mg/dL (ref 0.70–1.35)
Globulin: 2.7 g/dL (ref 1.9–3.7)
Glucose, Bld: 89 mg/dL (ref 65–139)
Potassium: 4.4 mmol/L (ref 3.5–5.3)
Sodium: 138 mmol/L (ref 135–146)
Total Bilirubin: 0.6 mg/dL (ref 0.2–1.2)
Total Protein: 6.7 g/dL (ref 6.1–8.1)

## 2023-11-11 LAB — CBC WITH DIFFERENTIAL/PLATELET
Absolute Lymphocytes: 2147 {cells}/uL (ref 850–3900)
Absolute Monocytes: 397 {cells}/uL (ref 200–950)
Basophils Absolute: 43 {cells}/uL (ref 0–200)
Basophils Relative: 0.7 %
Eosinophils Absolute: 250 {cells}/uL (ref 15–500)
Eosinophils Relative: 4.1 %
HCT: 44.4 % (ref 38.5–50.0)
Hemoglobin: 14.2 g/dL (ref 13.2–17.1)
MCH: 26.8 pg — ABNORMAL LOW (ref 27.0–33.0)
MCHC: 32 g/dL (ref 32.0–36.0)
MCV: 83.9 fL (ref 80.0–100.0)
MPV: 9.8 fL (ref 7.5–12.5)
Monocytes Relative: 6.5 %
Neutro Abs: 3264 {cells}/uL (ref 1500–7800)
Neutrophils Relative %: 53.5 %
Platelets: 342 Thousand/uL (ref 140–400)
RBC: 5.29 Million/uL (ref 4.20–5.80)
RDW: 13.4 % (ref 11.0–15.0)
Total Lymphocyte: 35.2 %
WBC: 6.1 Thousand/uL (ref 3.8–10.8)

## 2023-11-11 LAB — LIPID PANEL
Cholesterol: 109 mg/dL (ref <200)
HDL: 38 mg/dL — ABNORMAL LOW (ref >=40)
LDL Cholesterol (Calc): 55 mg/dL
Non-HDL Cholesterol (Calc): 71 mg/dL (ref <130)
Total CHOL/HDL Ratio: 2.9 (calc) (ref <5.0)
Triglycerides: 80 mg/dL (ref <150)

## 2023-11-11 LAB — PSA, TOTAL AND FREE
PSA, % Free: 36 % (ref >25)
PSA, Free: 0.5 ng/mL
PSA, Total: 1.4 ng/mL (ref <=4.0)

## 2023-11-11 LAB — TSH: TSH: 1.86 m[IU]/L (ref 0.40–4.50)

## 2023-12-02 ENCOUNTER — Other Ambulatory Visit: Payer: Self-pay | Admitting: Family

## 2023-12-02 DIAGNOSIS — I10 Essential (primary) hypertension: Secondary | ICD-10-CM

## 2023-12-11 ENCOUNTER — Encounter (HOSPITAL_COMMUNITY): Payer: Self-pay

## 2023-12-11 ENCOUNTER — Ambulatory Visit (HOSPITAL_COMMUNITY)
Admission: EM | Admit: 2023-12-11 | Discharge: 2023-12-11 | Disposition: A | Discharge status: Home / Self Care | Attending: Internal Medicine | Admitting: Internal Medicine

## 2023-12-11 DIAGNOSIS — M25512 Pain in left shoulder: Secondary | ICD-10-CM | POA: Diagnosis not present

## 2023-12-11 MED ORDER — METHYLPREDNISOLONE SODIUM SUCC 125 MG IJ SOLR
INTRAMUSCULAR | Status: AC
  Filled 2023-12-11: qty 2

## 2023-12-11 MED ORDER — METHYLPREDNISOLONE SODIUM SUCC 125 MG IJ SOLR: 60.0000 mg | Freq: Once | INTRAMUSCULAR | Status: DC

## 2023-12-11 MED ORDER — METHYLPREDNISOLONE SODIUM SUCC 125 MG IJ SOLR
60.0000 mg | Freq: Once | INTRAMUSCULAR | Status: AC
  Administered 2023-12-11: 60 mg via INTRAMUSCULAR

## 2023-12-11 NOTE — ED Triage Notes (Signed)
 Pt c/o lt arm pain x2wk. States unable to lift arm all the way up. States having throbbing to upper arm x2 days. States took advil  with no relief. Denies CP or SOB. Denies injury.

## 2023-12-11 NOTE — Discharge Instructions (Addendum)
 You were seen today for left shoulder pain.  I am unsure what the causes.  I wonder if you may have some injury to your rotator cuff.  You can take Tylenol 500 to 1000 mg every 6 hours as needed for pain.  We gave you an injection of steroid medicine today to help with pain and inflammation.  Recommend follow-up with sports medicine; contact information has been provided today.

## 2023-12-11 NOTE — ED Provider Notes (Signed)
 [facility]    CSN: [id number] Arrival date & time: 12/11/23  1404      History   Chief Complaint Chief Complaint  Patient presents with   Arm Pain    HPI Matthew Foster is a 68 y.o. male.   Patient presents today with 1 month history of left shoulder pain.  Reports over the past couple weeks, it has started to radiate down his upper arm.  He denies recent fall, trauma, or injury.  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  No numbness or tingling in the hand, pain radiating down to the hand, or pain radiating up to the jaw.  No chest pain or shortness of breath.  Patient was recently evaluated for atypical chest pain and underwent stress testing that was normal.  Has taken Advil  for the pain without improvement.  Denies history of surgeries to the shoulder.    Past Medical History:  Diagnosis Date   Carpal tunnel syndrome of left wrist    Chest pain    Edema    Gastric ulcer    Gout    HTN (hypertension)    Paronychia     Patient Active Problem List   Diagnosis Date Noted   Class 1 obesity due to excess calories with serious comorbidity and body mass index (BMI) of 34.0 to 34.9 in adult 06/12/2023   Hx of gout 12/11/2022   Hyperlipidemia LDL goal <100 05/17/2022   Essential hypertension 10/06/2010   Abnormal ECG 10/06/2010    Past Surgical History:  Procedure Laterality Date   ABDOMINAL SURGERY     gastric ulcer repair         Home Medications    Prior to Admission medications   Medication Sig Start Date End Date Taking? Authorizing Provider  allopurinol  (ZYLOPRIM ) 100 MG tablet TAKE 2 TABLETS(200 MG) BY MOUTH DAILY 09/03/23   Ngetich, Dinah C, NP  amLODipine  (NORVASC ) 10 MG tablet TAKE 1 TABLET(10 MG) BY MOUTH DAILY 09/09/23   Ngetich, Dinah C, NP  aspirin 81 MG tablet Take 81 mg by mouth daily.      [provider]  Ibuprofen  (ADVIL  PO) Take 200 mg by mouth as needed.    [provider]   lisinopril  (ZESTRIL ) 40 MG tablet TAKE 1 TABLET(40 MG) BY MOUTH DAILY 12/02/23   Ngetich, Dinah C, NP  meclizine  (ANTIVERT ) 12.5 MG tablet Take 1 tablet (12.5 mg total) by mouth 3 (three) times daily as needed for dizziness. 06/12/23   Ngetich, Dinah C, NP  metoprolol  succinate (TOPROL -XL) 100 MG 24 hr tablet TAKE 1 TABLET(100 MG) BY MOUTH DAILY WITH OR IMMEDIATELY FOLLOWING A MEAL 06/20/23   Ngetich, Dinah C, NP  rosuvastatin (CRESTOR) 10 MG tablet Take 10 mg by mouth daily.    [provider]  tirzepatide  (ZEPBOUND ) 2.5 MG/0.5ML Pen Inject 2.5 mg into the skin once a week. Patient not taking: Reported on 12/11/2023 06/12/23   Ngetich, Roxan BROCKS, NP    Family History Family History  Problem Relation Age of Onset   Diabetes Mother    Heart attack Mother    Stroke Father    Diabetes Father    Multiple sclerosis Sister    Hypertension Brother    Heart attack Brother    Hypertension Brother    Gout Brother    Hypertension Daughter    COPD Daughter    Gout Son     Social History Social History   Tobacco  Use   Smoking status: Never   Smokeless tobacco: Never  Vaping Use   Vaping status: Never Used  Substance Use Topics   Alcohol use: No   Drug use: No     Allergies   Patient has no known allergies.   Review of Systems Review of Systems Per HPI  Physical Exam Triage Vital Signs ED Triage Vitals [12/11/23 1414]  Encounter Vitals Group     BP 138/82     Girls Systolic BP Percentile      Girls Diastolic BP Percentile      Boys Systolic BP Percentile      Boys Diastolic BP Percentile      Pulse Rate 63     Resp 18     Temp 98 F (36.7 C)     Temp Source Oral     SpO2 97 %     Weight      Height      Head Circumference      Peak Flow      Pain Score 8     Pain Loc      Pain Education      Exclude from Growth Chart    No data found.  Updated Vital Signs BP 138/82 (BP Location: Right Arm)   Pulse 63   Temp 98 F (36.7 C) (Oral)   Resp 18   SpO2  97%   Visual Acuity Right Eye Distance:   Left Eye Distance:   Bilateral Distance:    Right Eye Near:   Left Eye Near:    Bilateral Near:     Physical Exam Vitals and nursing note reviewed.  Constitutional:      General: He is not in acute distress.    Appearance: Normal appearance. He is not toxic-appearing.  Pulmonary:     Effort: Pulmonary effort is normal. No respiratory distress.  Musculoskeletal:     Comments: Inspection: no swelling, bruising, obvious deformity or redness to left shoulder Palpation: tender to palpation left shoulder diffusely; no obvious deformities palpated ROM: Full ROM to left shoulder; patient has pain with front arm raise, lateral raise of left shoulder Strength: 5/5 bilateral upper extremities Neurovascular: neurovascularly intact in distal bilateral upper extremities   Skin:    General: Skin is warm and dry.     Capillary Refill: Capillary refill takes less than 2 seconds.     Coloration: Skin is not jaundiced or pale.     Findings: No erythema.  Neurological:     Mental Status: He is alert and oriented to person, place, and time.  Psychiatric:        Behavior: Behavior is cooperative.      UC Treatments / Results  Labs (all labs ordered are listed, but only abnormal results are displayed) Labs Reviewed - No data to display  EKG   Radiology No results found.  Procedures Procedures (including critical care time)  Medications Ordered in UC Medications  methylPREDNISolone  sodium succinate (SOLU-MEDROL ) 125 mg/2 mL injection 60 mg (has no administration in time range)    Initial Impression / Assessment and Plan / UC Course  I have reviewed the triage vital signs and the nursing notes.  Pertinent labs & imaging results that were available during my care of the patient were reviewed by me and considered in my medical decision making (see chart for details).   Patient is a very pleasant, well-appearing 68 year old African-American  male presenting today for left shoulder pain.  On exam, no acute  abnormalities are appreciated of the left shoulder.  He has tenderness with certain movements, suspect musculoskeletal cause.  A lidocaine patch was applied in urgent care today and recommended purchasing over-the-counter lidocaine patches if pain improves with this.  Injection of Solu-Medrol  given in urgent care today for pain and inflammation additionally as patient is unable to take NSAIDs secondary to cardiovascular disease.  EKG performed in triage today and stable when compared with previous EKG.  Recommended follow-up with cardiology as planned.  Follow-up with sports medicine for further evaluation and management if symptoms do not improve with treatment.  Strict ER and return precautions were discussed with patient.  The patient was given the opportunity to ask questions.  All questions answered to their satisfaction.  The patient is in agreement to this plan.   Final Clinical Impressions(s) / UC Diagnoses   Final diagnoses:  Acute pain of left shoulder     Discharge Instructions      You were seen today for left shoulder pain.  I am unsure what the causes.  I wonder if you may have some injury to your rotator cuff.  You can take Tylenol 500 to 1000 mg every 6 hours as needed for pain.  We gave you an injection of steroid medicine today to help with pain and inflammation.  Recommend follow-up with sports medicine; contact information has been provided today.   ED Prescriptions   None    PDMP not reviewed this encounter.   Chandra Harlene LABOR, NP 12/11/23 1640

## 2023-12-12 ENCOUNTER — Telehealth: Payer: Self-pay | Admitting: *Deleted

## 2023-12-12 ENCOUNTER — Encounter: Payer: Self-pay | Admitting: Family

## 2023-12-12 NOTE — Telephone Encounter (Signed)
 Tried to call patient. LM of changed appointment. Scheduled AWV for 1/5 at 3:20 and canceled the others

## 2023-12-12 NOTE — Telephone Encounter (Signed)
 Copied from CRM #[id number]. Topic: Appointments - Scheduling Inquiry for Clinic >> Dec 12, 2023  8:38 AM Matthew Foster wrote: Reason for CRM: Matthew Foster called from [phone] advised that he looked at his mychart for the AWV appt he was confused due to having two of the same appt, he stated that he needs to switch it from video to inperson and have it for late afternoon due to his work schedule.

## 2023-12-26 NOTE — Progress Notes (Signed)
   This encounter was created in error - please disregard. No show

## 2023-12-30 ENCOUNTER — Encounter: Payer: Self-pay | Admitting: Family

## 2023-12-30 ENCOUNTER — Ambulatory Visit: Admitting: Family

## 2023-12-30 ENCOUNTER — Ambulatory Visit
Admission: RE | Admit: 2023-12-30 | Discharge: 2023-12-30 | Disposition: A | Source: Ambulatory Visit | Attending: Family | Admitting: Family

## 2023-12-30 VITALS — BP 136/72 | HR 76 | Temp 98.0°F | Ht 75.0 in | Wt 280.6 lb

## 2023-12-30 DIAGNOSIS — G8929 Other chronic pain: Secondary | ICD-10-CM

## 2023-12-30 DIAGNOSIS — E6609 Other obesity due to excess calories: Secondary | ICD-10-CM

## 2023-12-30 DIAGNOSIS — E785 Hyperlipidemia, unspecified: Secondary | ICD-10-CM | POA: Diagnosis not present

## 2023-12-30 DIAGNOSIS — Z6834 Body mass index (BMI) 34.0-34.9, adult: Secondary | ICD-10-CM

## 2023-12-30 DIAGNOSIS — E66811 Obesity, class 1: Secondary | ICD-10-CM | POA: Diagnosis not present

## 2023-12-30 DIAGNOSIS — M25512 Pain in left shoulder: Secondary | ICD-10-CM | POA: Diagnosis not present

## 2023-12-30 DIAGNOSIS — I10 Essential (primary) hypertension: Secondary | ICD-10-CM | POA: Diagnosis not present

## 2023-12-30 NOTE — Progress Notes (Signed)
 "  Provider: Joelie Schou FNP-C   [name], Roxan BROCKS, NP  Patient Care Team: Fleda Pagel, Roxan BROCKS, NP as PCP - General (Family Medicine)  Extended Emergency Contact Information Primary Emergency Contact: Baldonado,Brittany Mobile Phone: [phone] Relation: Daughter Secondary Emergency Contact: Everingham,Jarred  [place] Mobile Phone: [phone] Relation: Son  Code Status:  Full Code  Goals of care: Advanced Directive information    12/30/2023    1:43 PM  Advanced Directives  Does Patient Have a Medical Advance Directive? No  Would patient like information on creating a medical advance directive? No - Patient declined     Chief Complaint  Patient presents with   Medical Management of Chronic Issues    6 month follow up    Arm Pain    Left arm. Pain when he raises his arm. He went to urgent care and they gave him an injection Started about 30+ days ago.     History of Present Illness   Matthew Foster is a 68 year old male who presents for 6 months follow up.He complains of left shoulder/ arm pain.  He experiences soreness and throbbing pain in his left shoulder/arm, particularly when raising it above his head, extending from the shoulder to the upper arm. A visit to urgent care two weeks ago resulted in a pain-relieving injection; the patient recalls being told it may be a rotator cuff issue, though no imaging studies were performed. No weakness in the fingers or arm is noted.  A stress test conducted about a month ago returned normal results. No shortness of breath or wheezing is reported.  He is on allopurinol  100 mg every other day for gout, which has not flared up recently. For hypertension, he takes amlodipine  10 mg, lisinopril  40 mg, and metoprolol  100 mg daily, with blood pressure readings generally within normal range, including a recent reading of 139/84. He also takes rosuvastatin 10 mg daily for cholesterol, along with ibuprofen  and aspirin.  His weight  is 280.6 pounds, a slight increase from 279 pounds six months ago. He engages in walking exercises two to three times a week for about 20 minutes and has recently ordered an exercise bike to increase his physical activity.  He works night shifts and uses the bathroom two to three times during the night while at work. He sleeps during the day and may get up once or twice to use the bathroom. His PSA levels were normal six months ago.  He has received all his vaccinations, including shingles, COVID-19, pneumonia, and influenza vaccines, with the most recent ones administered in late September. He recently got new glasses and partial dentures.  No issues with anxiety, depression, or sleep disturbances. No gastrointestinal issues such as constipation or diarrhea. No pain in his back or legs, and no tenderness or numbness in his legs. No neck pain or tenderness in his forehead or sinuses.   Past Medical History:  Diagnosis Date   Carpal tunnel syndrome of left wrist    Chest pain    Edema    Gastric ulcer    Gout    HTN (hypertension)    Paronychia    Past Surgical History:  Procedure Laterality Date   ABDOMINAL SURGERY     gastric ulcer repair      Allergies[1]  Allergies as of 12/30/2023   No Known Allergies      Medication List        Accurate as of December 30, 2023  8:10 PM. If  you have any questions, ask your nurse or doctor.          STOP taking these medications    meclizine  12.5 MG tablet Commonly known as: ANTIVERT  Stopped by: Crystel Demarco, NP   Zepbound  2.5 MG/0.5ML Pen Generic drug: tirzepatide  Stopped by: Roxan Plough, NP       TAKE these medications    ADVIL  PO Take 200 mg by mouth as needed.   allopurinol  100 MG tablet Commonly known as: ZYLOPRIM  TAKE 2 TABLETS(200 MG) BY MOUTH DAILY   amLODipine  10 MG tablet Commonly known as: NORVASC  TAKE 1 TABLET(10 MG) BY MOUTH DAILY   aspirin 81 MG tablet Take 81 mg by mouth daily.   lisinopril  40  MG tablet Commonly known as: ZESTRIL  TAKE 1 TABLET(40 MG) BY MOUTH DAILY   metoprolol  succinate 100 MG 24 hr tablet Commonly known as: TOPROL -XL TAKE 1 TABLET(100 MG) BY MOUTH DAILY WITH OR IMMEDIATELY FOLLOWING A MEAL   rosuvastatin 10 MG tablet Commonly known as: CRESTOR Take 10 mg by mouth daily.        Review of Systems  Constitutional:  Negative for appetite change, chills, fatigue, fever and unexpected weight change.  HENT:  Negative for congestion, dental problem, ear discharge, ear pain, hearing loss, nosebleeds, postnasal drip, rhinorrhea, sinus pressure, sinus pain, sneezing, sore throat, tinnitus and trouble swallowing.   Eyes:  Negative for pain, discharge, redness, itching and visual disturbance.  Respiratory:  Negative for cough, chest tightness, shortness of breath and wheezing.   Cardiovascular:  Negative for chest pain, palpitations and leg swelling.  Gastrointestinal:  Negative for abdominal distention, abdominal pain, constipation, diarrhea, nausea and vomiting.  Endocrine: Negative for cold intolerance, heat intolerance, polydipsia, polyphagia and polyuria.  Genitourinary:  Negative for difficulty urinating, dysuria, flank pain, frequency and urgency.  Musculoskeletal:  Positive for arthralgias. Negative for back pain, gait problem, joint swelling, myalgias, neck pain and neck stiffness.       Left shoulder and arm pain   Skin:  Negative for color change, pallor, rash and wound.  Neurological:  Negative for dizziness, syncope, speech difficulty, weakness, light-headedness, numbness and headaches.  Hematological:  Does not bruise/bleed easily.  Psychiatric/Behavioral:  Negative for agitation, behavioral problems, confusion, hallucinations, self-injury, sleep disturbance and suicidal ideas. The patient is not nervous/anxious.     Immunization History  Administered Date(s) Administered   Fluad Trivalent(High Dose 65+) 12/10/2022   Influenza-Unspecified 09/29/2023    PFIZER(Purple Top)SARS-COV-2 Vaccination 04/18/2019, 10/25/2019, 05/11/2020   PNEUMOCOCCAL CONJUGATE-20 12/10/2022   Pfizer Covid-19 Vaccine Bivalent Booster 87yrs & up 03/04/2021, 12/30/2021   Tdap 01/07/2017   Zoster Recombinant(Shingrix) 04/01/2021   Pertinent  Health Maintenance Due  Topic Date Due   Colonoscopy  03/31/2025   Influenza Vaccine  Completed      05/16/2022    8:29 AM 12/26/2022    2:33 PM 12/31/2022    8:44 AM 06/12/2023    3:06 PM 12/30/2023    1:42 PM  Fall Risk  Falls in the past year? 0 0  0 0 0  Was there an injury with Fall? 0  0   0  0  0  Fall Risk Category Calculator 0 0  0 0 0  Patient at Risk for Falls Due to No Fall Risks  No Fall Risks No Fall Risks No Fall Risks  Fall risk Follow up Falls evaluation completed  Falls evaluation completed Falls evaluation completed Falls evaluation completed     Manually entered by patient   Data  saved with a previous flowsheet row definition   Functional Status Survey:    Vitals:   12/30/23 1340 12/30/23 1354  BP: (!) 148/84 136/72  Pulse: 76   Temp: 98 F (36.7 C)   TempSrc: Temporal   SpO2: 95%   Weight: 280 lb 9.6 oz (127.3 kg)   Height: 6' 3 (1.905 m)    Body mass index is 35.07 kg/m. Physical Exam VITALS: T- 98.0, P- 76, BP- 148/84, SaO2- 95% MEASUREMENTS: Weight- 280.6. GENERAL: Alert, cooperative, well developed, no acute distress. HEENT: Normocephalic, normal oropharynx, moist mucous membranes, ears normal, no infection, clean bilaterally, nose normal, no sinus tenderness. CHEST: Clear to auscultation bilaterally, no wheezes, rhonchi, or crackles. CARDIOVASCULAR: Normal heart rate and rhythm, S1 and S2 normal without murmurs. ABDOMEN: Soft, non-tender, non-distended, without organomegaly, normal bowel sounds. EXTREMITIES: No cyanosis or edema. MUSCULOSKELETAL: Spine non-tender, hips normal range of motion, right shoulder normal range of motion, left shoulder non-tender. NEUROLOGICAL:  Cranial nerves grossly intact, moves all extremities without gross motor or sensory deficit, neurological exam normal.  SKIN: No rash,no lesion or erythema   PSYCHIATRY/BEHAVIORAL: Mood stable   Labs reviewed: Recent Labs    06/13/23 1358 09/13/23 2007  NA 138 139  143  K 4.4 4.2  4.2  CL 105 103  103  CO2 26 25  GLUCOSE 89 59*  64*  BUN 22 15  17   CREATININE 1.15 1.08  1.10  CALCIUM 9.3 8.7*   Recent Labs    06/13/23 1358  AST 17  ALT 21  BILITOT 0.6  PROT 6.7   Recent Labs    06/13/23 1358 09/13/23 2007  WBC 6.1 6.5  NEUTROABS 3,264  --   HGB 14.2 14.1  15.0  HCT 44.4 44.8  44.0  MCV 83.9 86.0  PLT 342 323   Lab Results  Component Value Date   TSH 1.86 06/13/2023   No results found for: HGBA1C Lab Results  Component Value Date   CHOL 109 06/13/2023   HDL 38 (L) 06/13/2023   LDLCALC 55 06/13/2023   TRIG 80 06/13/2023   CHOLHDL 2.9 06/13/2023    Significant Diagnostic Results in last 30 days:  No results found.  Assessment/Plan Chronic left shoulder pain Pain radiating from the shoulder to the upper arm, exacerbated by raising the arm above the head. No weakness in fingers or arm. Pain is constant with occasional throbbing. Previous urgent care visit suggested rotator cuff involvement, but no imaging was performed. - Referred to orthopedic specialist for further evaluation - Ordered x-ray of left shoulder to assess for underlying issues - Provided address for [facility] for x-ray  Essential hypertension Blood pressure readings at home are generally well-controlled, with recent readings of 139/84 mmHg. In-office readings were slightly elevated at 148/84 mmHg on the left hand and 136/72 mmHg on the right hand. Current medications include amlodipine , lisinopril , and metoprolol . - Continue current antihypertensive medications: amlodipine , lisinopril , and metoprolol   Hyperlipidemia Currently managed with rosuvastatin 10 mg daily. -  Continue rosuvastatin 10 mg daily  Class 1 obesity Weight has increased slightly since last visit, with current weight at 280.6 lbs. Engages in regular physical activity, including walking and plans to use an exercise bike. - Encouraged continued physical activity, including walking and use of exercise bike - Discussed potential for indoor exercises during winter months  General Health Maintenance Immunizations are up to date, including tetanus, pneumonia, flu, shingles, and COVID-19 vaccines. Recent dental and vision check-ups completed. No issues with anxiety,  depression, or sleep. No gastrointestinal issues reported. - Continue routine health maintenance and screenings - Ensure regular dental and vision check-ups - Maintain up-to-date immunizations   Family/ staff Communication: Reviewed plan of care with patient verbalized understanding   Labs/tests ordered:  - Dg shoulder left  - CBC with Differential/Platelet - CMP with eGFR([facility]) - TSH - Lipid panel  Next Appointment : Return in about 6 months (around 06/29/2024) for medical mangement of chronic issues.SABRA   Spent 30 minutes of Face to face and non-face to face with patient  >50% time spent counseling; reviewing medical record; tests; labs; documentation and developing future plan of care.   Roxan BROCKS Shanyah Gattuso, NP      [1] No Known Allergies  "

## 2023-12-31 LAB — COMPLETE METABOLIC PANEL WITHOUT GFR
AG Ratio: 1.6 (calc) (ref 1.0–2.5)
ALT: 20 U/L (ref 9–46)
AST: 19 U/L (ref 10–35)
Albumin: 4.1 g/dL (ref 3.6–5.1)
Alkaline phosphatase (APISO): 91 U/L (ref 35–144)
BUN: 14 mg/dL (ref 7–25)
CO2: 28 mmol/L (ref 20–32)
Calcium: 9.2 mg/dL (ref 8.6–10.3)
Chloride: 106 mmol/L (ref 98–110)
Creat: 1.05 mg/dL (ref 0.70–1.35)
Globulin: 2.5 g/dL (ref 1.9–3.7)
Glucose, Bld: 106 mg/dL (ref 65–139)
Potassium: 4.5 mmol/L (ref 3.5–5.3)
Sodium: 141 mmol/L (ref 135–146)
Total Bilirubin: 0.3 mg/dL (ref 0.2–1.2)
Total Protein: 6.6 g/dL (ref 6.1–8.1)

## 2023-12-31 LAB — CBC WITH DIFFERENTIAL/PLATELET
Absolute Lymphocytes: 1447 {cells}/uL (ref 850–3900)
Absolute Monocytes: 410 {cells}/uL (ref 200–950)
Basophils Absolute: 49 {cells}/uL (ref 0–200)
Basophils Relative: 0.9 %
Eosinophils Absolute: 329 {cells}/uL (ref 15–500)
Eosinophils Relative: 6.1 %
HCT: 44.6 % (ref 39.4–51.1)
Hemoglobin: 14.6 g/dL (ref 13.2–17.1)
MCH: 26.6 pg — ABNORMAL LOW (ref 27.0–33.0)
MCHC: 32.7 g/dL (ref 31.6–35.4)
MCV: 81.4 fL (ref 81.4–101.7)
MPV: 10.1 fL (ref 7.5–12.5)
Monocytes Relative: 7.6 %
Neutro Abs: 3164 {cells}/uL (ref 1500–7800)
Neutrophils Relative %: 58.6 %
Platelets: 332 Thousand/uL (ref 140–400)
RBC: 5.48 Million/uL (ref 4.20–5.80)
RDW: 14.1 % (ref 11.0–15.0)
Total Lymphocyte: 26.8 %
WBC: 5.4 Thousand/uL (ref 3.8–10.8)

## 2023-12-31 LAB — TSH: TSH: 1.43 m[IU]/L (ref 0.40–4.50)

## 2023-12-31 LAB — LIPID PANEL
Cholesterol: 163 mg/dL
HDL: 50 mg/dL
LDL Cholesterol (Calc): 94 mg/dL
Non-HDL Cholesterol (Calc): 113 mg/dL
Total CHOL/HDL Ratio: 3.3 (calc)
Triglycerides: 98 mg/dL

## 2024-01-01 ENCOUNTER — Encounter: Payer: PPO | Admitting: Family

## 2024-01-01 ENCOUNTER — Ambulatory Visit: Payer: Self-pay | Admitting: Family

## 2024-01-03 ENCOUNTER — Encounter: Payer: PPO | Admitting: Family

## 2024-01-06 ENCOUNTER — Encounter: Payer: Self-pay | Admitting: Family

## 2024-01-06 ENCOUNTER — Ambulatory Visit: Admitting: Family

## 2024-01-06 VITALS — BP 132/80 | HR 65 | Temp 97.8°F | Resp 20 | Ht 75.0 in | Wt 280.0 lb

## 2024-01-06 DIAGNOSIS — Z0000 Encounter for general adult medical examination without abnormal findings: Secondary | ICD-10-CM | POA: Diagnosis not present

## 2024-01-06 NOTE — Progress Notes (Signed)
 "  Chief Complaint  Patient presents with   Medicare Wellness    Annual Wellness Visit.     Subjective:   Matthew Foster is a 69 y.o. male who presents for a Medicare Annual Wellness Visit.  Visit info / Clinical Intake: Medicare Wellness Visit Type:: Initial Annual Wellness Visit Persons participating in visit and providing information:: patient Medicare Wellness Visit Mode:: In-person (required for WTM) Interpreter Needed?: No Pre-visit prep was completed: yes AWV questionnaire completed by patient prior to visit?: no Living arrangements:: (!) lives alone Patient's Overall Health Status Rating: good Typical amount of pain: some Does pain affect daily life?: no Are you currently prescribed opioids?: no  Dietary Habits and Nutritional Risks How many meals a day?: 3 Eats fruit and vegetables daily?: (!) no Most meals are obtained by: preparing own meals In the last 2 weeks, have you had any of the following?: none Diabetic:: no  Functional Status Activities of Daily Living (to include ambulation/medication): (Patient-Rptd) Independent Ambulation: (Patient-Rptd) Independent Medication Administration: (Patient-Rptd) Independent Home Management (perform basic housework or laundry): (Patient-Rptd) Independent Manage your own finances?: (Patient-Rptd) yes Primary transportation is: (Patient-Rptd) driving  Fall Screening Falls in the past year?: 0 Number of falls in past year: 0 Was there an injury with Fall?: 0 Fall Risk Category Calculator: 0 Patient Fall Risk Level: Low Fall Risk  Fall Risk Patient at Risk for Falls Due to: No Fall Risks Fall risk Follow up: Falls evaluation completed  Home and Transportation Safety: All rugs have non-skid backing?: (Patient-Rptd) yes All stairs or steps have railings?: (Patient-Rptd) N/A, no stairs Grab bars in the bathtub or shower?: (!) (Patient-Rptd) no Have non-skid surface in bathtub or shower?: (Patient-Rptd) yes Good home  lighting?: (Patient-Rptd) yes Regular seat belt use?: (Patient-Rptd) yes Hospital stays in the last year:: (Patient-Rptd) no  Cognitive Assessment Difficulty concentrating, remembering, or making decisions? : no Will 6CIT or Mini Cog be Completed: yes What year is it?: 0 points What month is it?: 0 points Give patient an address phrase to remember (5 components): [address] [facility] [place]  About what time is it?: 0 points Count backwards from 20 to 1: 0 points Say the months of the year in reverse: 0 points Repeat the address phrase from earlier: 2 points 6 CIT Score: 2 points  Advance Directives (For Healthcare) Does Patient Have a Medical Advance Directive?: No Would patient like information on creating a medical advance directive?: No - Patient declined    Allergies (verified) Patient has no known allergies.   Current Medications (verified) Outpatient Encounter Medications as of 01/06/2024  Medication Sig   allopurinol  (ZYLOPRIM ) 100 MG tablet TAKE 2 TABLETS(200 MG) BY MOUTH DAILY   amLODipine  (NORVASC ) 10 MG tablet TAKE 1 TABLET(10 MG) BY MOUTH DAILY   aspirin 81 MG tablet Take 81 mg by mouth daily.     Ibuprofen  (ADVIL  PO) Take 200 mg by mouth as needed.   lisinopril  (ZESTRIL ) 40 MG tablet TAKE 1 TABLET(40 MG) BY MOUTH DAILY   metoprolol  succinate (TOPROL -XL) 100 MG 24 hr tablet TAKE 1 TABLET(100 MG) BY MOUTH DAILY WITH OR IMMEDIATELY FOLLOWING A MEAL   rosuvastatin (CRESTOR) 10 MG tablet Take 10 mg by mouth daily.   No facility-administered encounter medications on file as of 01/06/2024.    History: Past Medical History:  Diagnosis Date   Carpal tunnel syndrome of left wrist    Chest pain    Edema    Gastric ulcer    Gout  HTN (hypertension)    Paronychia    Past Surgical History:  Procedure Laterality Date   ABDOMINAL SURGERY     gastric ulcer repair     Family History  Problem Relation Age of Onset   Diabetes Mother    Heart attack Mother     Stroke Father    Diabetes Father    Multiple sclerosis Sister    Hypertension Brother    Heart attack Brother    Hypertension Brother    Gout Brother    Hypertension Daughter    COPD Daughter    Gout Son    Social History   Occupational History   Not on file  Tobacco Use   Smoking status: Never   Smokeless tobacco: Never  Vaping Use   Vaping status: Never Used  Substance and Sexual Activity   Alcohol use: No   Drug use: No   Sexual activity: Not on file   Tobacco Counseling Counseling given: Not Answered  SDOH Screenings   Food Insecurity: No Food Insecurity (01/06/2024)  Housing: Unknown (01/06/2024)  Recent Concern: Housing - High Risk (12/10/2023)  Transportation Needs: No Transportation Needs (01/06/2024)  Utilities: Not At Risk (01/06/2024)  Depression (PHQ2-9): Low Risk (01/06/2024)  Financial Resource Strain: Low Risk (12/10/2023)  Physical Activity: Insufficiently Active (12/10/2023)  Social Connections: Moderately Integrated (12/10/2023)  Stress: No Stress Concern Present (12/10/2023)  Tobacco Use: Low Risk (01/06/2024)   See flowsheets for full screening details  Depression Screen PHQ 2 & 9 Depression Scale- Over the past 2 weeks, how often have you been bothered by any of the following problems? Little interest or pleasure in doing things: 0 Feeling down, depressed, or hopeless (PHQ Adolescent also includes...irritable): 0 PHQ-2 Total Score: 0     Goals Addressed   None          Objective:    Today's Vitals   01/06/24 1537  BP: 132/80  Pulse: 65  Resp: 20  Temp: 97.8 F (36.6 C)  SpO2: 96%  Weight: 280 lb (127 kg)   Body mass index is 35 kg/m.  Hearing/Vision screen Hearing Screening - Comments:: No hearing issues. Vision Screening - Comments:: Eye exam April 2025 [facility]) no new issues pt wear glasses. Immunizations and Health Maintenance Health Maintenance  Topic Date Due   Medicare Annual Wellness (AWV)  12/31/2023   COVID-19  Vaccine (7 - 2025-26 season) 03/28/2024   Colonoscopy  03/31/2025   DTaP/Tdap/Td (2 - Td or Tdap) 01/08/2027   Pneumococcal Vaccine: 50+ Years  Completed   Influenza Vaccine  Completed   Hepatitis C Screening  Completed   Zoster Vaccines- Shingrix  Completed   Meningococcal B Vaccine  Aged Out        Assessment/Plan:  This is a routine wellness examination for [place].  Patient Care Team: Tinlee Navarrette C, NP as PCP - General (Family Medicine)  I have personally reviewed and noted the following in the patients chart:   Medical and social history Use of alcohol, tobacco or illicit drugs  Current medications and supplements including opioid prescriptions. Functional ability and status Nutritional status Physical activity Advanced directives List of other physicians Hospitalizations, surgeries, and ER visits in previous 12 months Vitals Screenings to include cognitive, depression, and falls Referrals and appointments  No orders of the defined types were placed in this encounter.  In addition, I have reviewed and discussed with patient certain preventive protocols, quality metrics, and best practice recommendations. A written personalized care plan for preventive  services as well as general preventive health recommendations were provided to patient.   Roxan BROCKS Cai Anfinson, NP   01/06/2024   No follow-ups on file.  After Visit Summary: (In Person-Printed) AVS printed and given to the patient  Nurse Notes: Up to date  "

## 2024-01-16 ENCOUNTER — Other Ambulatory Visit: Payer: Self-pay

## 2024-01-16 ENCOUNTER — Encounter: Payer: Self-pay | Admitting: Sports Medicine

## 2024-01-16 ENCOUNTER — Ambulatory Visit: Admitting: Sports Medicine

## 2024-01-16 DIAGNOSIS — M25512 Pain in left shoulder: Secondary | ICD-10-CM | POA: Diagnosis not present

## 2024-01-16 DIAGNOSIS — G8929 Other chronic pain: Secondary | ICD-10-CM

## 2024-01-16 DIAGNOSIS — M7582 Other shoulder lesions, left shoulder: Secondary | ICD-10-CM | POA: Diagnosis not present

## 2024-01-16 DIAGNOSIS — M19012 Primary osteoarthritis, left shoulder: Secondary | ICD-10-CM

## 2024-01-16 MED ORDER — METHYLPREDNISOLONE ACETATE 40 MG/ML IJ SUSP
80.0000 mg | INTRAMUSCULAR | Status: AC | PRN
  Administered 2024-01-16: 80 mg via INTRA_ARTICULAR

## 2024-01-16 MED ORDER — BUPIVACAINE HCL 0.25 % IJ SOLN
2.0000 mL | INTRAMUSCULAR | Status: AC | PRN
  Administered 2024-01-16: 2 mL via INTRA_ARTICULAR

## 2024-01-16 MED ORDER — LIDOCAINE HCL 1 % IJ SOLN
2.0000 mL | INTRAMUSCULAR | Status: AC | PRN
  Administered 2024-01-16: 2 mL

## 2024-01-16 NOTE — Progress Notes (Signed)
 Patient says that he has had left shoulder pain for a couple of months. He denies having any known injury or change in activity around that time. 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 He is left-hand dominant, so uses the left arm to hook up the trailers. He points over the deltoid when describing his pain. He does have throbbing at rest, and is able to move the arm overhead although it is very painful with all ROM. He denies having any clicking or popping in the shoulder, and denies any pain or numbness/tingling down the arm. Patient has no history of shoulder pain. He takes Advil  as needed, as well as uses Voltaren gel, which both give partial relief.

## 2024-01-16 NOTE — Progress Notes (Addendum)
 "  Dontarious Foster - 69 y.o. male MRN [id number]  Date of birth: 05-23-1955  Office Visit Note: Visit Date: 01/16/2024 PCP: Leonarda Roxan BROCKS, NP Referred by: Leonarda Roxan BROCKS, NP  Subjective: Chief Complaint  Patient presents with   Left Shoulder - Pain   HPI: Matthew Foster is a pleasant 69 y.o. male who presents today for chronic left shoulder pain. He is LHD.  Discussed the use of AI scribe software for clinical note transcription with the patient, who gave verbal consent to proceed.  History of Present Illness Matthew Foster is a 69 year old male with left shoulder pain who presents with several months of left shoulder and lateral deltoid arm pain.  He has persistent throbbing pain over the anterior and lateral left shoulder and arm between the elbow and shoulder for several months without trauma. Pain does not radiate past the elbow and he denies numbness or paresthesias.  Pain worsens with overhead reaching, extension, abduction, reaching out, sleep, and driving, though he can still perform these movements. Motion is limited. 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 ROM in all planes is painful.  He has tried oral ibuprofen  and topical diclofenac with only partial relief.    Pertinent ROS were reviewed with the patient and found to be negative unless otherwise specified above in HPI.   Assessment & Plan: Visit Diagnoses:  1. Primary osteoarthritis, left shoulder   2. Rotator cuff tendinitis, left   3. Chronic left shoulder pain    Assessment & Plan Primary osteoarthritis of the left shoulder - acute exacerbation of chronic condition Moderate GHJ osteoarthritis with joint space narrowing and arthritic changes, causing pain and sleep disturbance. Current flare due to inflammation from degenerative changes. - Administered ultrasound-guided corticosteroid injection to the left shoulder, see  procedure note below - Advised avoidance of heavy lifting and limited use of the left shoulder for today and tomorrow. - Recommended ice application for 15-20 minutes as needed for soreness. - PT for ROM and shoulder activation/RC, referral sent - Advised use of ibuprofen  or acetaminophen for analgesia. - Scheduled follow-up in six weeks to reassess symptoms.  Rotator cuff tendinitis of the left shoulder Rotator cuff tendinitis with pain on movement and resisted testing, likely flared secondary to osteoarthritis. Has pain with motion but relatively good strength, lower suspicion for high-grade tearing at this time. - Referred to physical therapy for rotator cuff stabilization and improved range of motion. - Instructed to start physical therapy after pain improves post-injection. - Use Advil , Tylenol or over-the-counter anti-inflammatories as needed    Follow-up: Return in about 6 weeks (around 02/27/2024) for L-shoulder .   Meds & Orders: No orders of the defined types were placed in this encounter.   Orders Placed This Encounter  Procedures   Large Joint Inj   US  Guided Needle Placement - No Linked Charges   Ambulatory referral to Physical Therapy     Procedures: Large Joint Inj: L glenohumeral on 01/16/2024 1:44 PM Indications: pain Details: 22 G 3.5 in needle, ultrasound-guided posterior approach Medications: 2 mL lidocaine  1 %; 2 mL bupivacaine  0.25 %; 80 mg methylPREDNISolone  acetate 40 MG/ML Outcome: tolerated well, no immediate complications  US -guided glenohumeral joint injection, left shoulder After discussion on risks/benefits/indications, informed verbal consent was obtained. A timeout was then performed. The patient was positioned lying lateral recumbent on examination table. [name] shoulder was prepped with betadine and multiple  alcohol swabs and utilizing ultrasound guidance, the patient's glenohumeral joint was identified on ultrasound. Using ultrasound guidance  a 22-gauge, 3.5 inch needle with a mixture of 2:2:2 cc's lidocaine :bupivicaine:depomedrol was directed from a lateral to medial direction via in-plane technique into the glenohumeral joint with visualization of appropriate spread of injectate into the joint. Patient tolerated the procedure well without immediate complications.      Procedure, treatment alternatives, risks and benefits explained, specific risks discussed. Consent was given by the patient. Immediately prior to procedure a time out was called to verify the correct patient, procedure, equipment, support staff and site/side marked as required. Patient was prepped and draped in the usual sterile fashion.          Clinical History: No specialty comments available.  He reports that he has never smoked. He has never used smokeless tobacco. No results for input(s): HGBA1C, LABURIC in the last 8760 hours.  Objective:    Physical Exam  Gen: Well-appearing, in no acute distress; non-toxic CV: Well-perfused. Warm.  Resp: Breathing unlabored on room air; no wheezing. Psych: Fluid speech in conversation; appropriate affect; normal thought process  *MSK/Ortho Exam: Physical Exam  *Left shoulder: No significant redness swelling or effusion.  There is pain with palpation deep within the anterior joint recess as well as around Codman's point.  There is about 10 degrees less of forward flexion and abduction with a bony block taken even passively.  There is pain with external rotation at the 90/90 position.  There is pain with drop arm test.  There is pain with empty can and resisted abduction although relatively well-preserved strength.  Internal/external strength is preserved.   MUSCULOSKELETAL: Left shoulder exhibits pain on motion and palpation, particularly anteriorly and laterally. Rotator cuff strength is intact.  Imaging:  *Three-view x-ray of the left shoulder including AP, scapular Y and axillary view were independently  reviewed and interpreted by myself today.  There is at least mild to moderate glenohumeral joint arthritic change with joint space narrowing.  Humeral head is well located within the groove without inferior or superior migration.  There is inferior glenoid spurring as well as at least moderate AC joint arthritic change with subchondral cystic change.  Narrative & Impression  CLINICAL DATA:  Left shoulder pain.   EXAM: LEFT SHOULDER - 2+ VIEW   COMPARISON:  None Available.   FINDINGS: There is no evidence of fracture or dislocation. Inferior glenohumeral spurring. Moderate acromioclavicular degenerative change with spurring and subchondral cysts. No erosions or evidence of focal bone lesion. Soft tissues are unremarkable.   IMPRESSION: Moderate acromioclavicular and mild glenohumeral osteoarthritis.     Electronically Signed   By: Andrea Gasman M.D.   On: 01/10/2024 17:25      Past Medical/Family/Surgical/Social History: Medications & Allergies reviewed per EMR, new medications updated. Patient Active Problem List   Diagnosis Date Noted   Class 1 obesity due to excess calories with serious comorbidity and body mass index (BMI) of 34.0 to 34.9 in adult 06/12/2023   Hx of gout 12/11/2022   Hyperlipidemia LDL goal <100 05/17/2022   Essential hypertension 10/06/2010   Abnormal ECG 10/06/2010   Past Medical History:  Diagnosis Date   Carpal tunnel syndrome of left wrist    Chest pain    Edema    Gastric ulcer    Gout    HTN (hypertension)    Paronychia    Family History  Problem Relation Age of Onset   Diabetes Mother    Heart  attack Mother    Stroke Father    Diabetes Father    Multiple sclerosis Sister    Hypertension Brother    Heart attack Brother    Hypertension Brother    Gout Brother    Hypertension Daughter    COPD Daughter    Gout Son    Past Surgical History:  Procedure Laterality Date   ABDOMINAL SURGERY     gastric ulcer repair     Social  History   Occupational History   Not on file  Tobacco Use   Smoking status: Never   Smokeless tobacco: Never  Vaping Use   Vaping status: Never Used  Substance and Sexual Activity   Alcohol use: No   Drug use: No   Sexual activity: Not on file   "

## 2024-02-27 ENCOUNTER — Ambulatory Visit: Admitting: Sports Medicine

## 2024-06-29 ENCOUNTER — Ambulatory Visit: Admitting: Family

## 2025-01-07 ENCOUNTER — Ambulatory Visit: Admitting: Family
